# Patient Record
Sex: Female | Born: 2000 | Race: White | Hispanic: Yes | Marital: Single | Smoking: Never smoker
Health system: Southern US, Community
[De-identification: ages and names within clinical notes are randomized; demographics above are authoritative.]

## PROBLEM LIST (undated history)

## (undated) DIAGNOSIS — Z789 Other specified health status: Secondary | ICD-10-CM

## (undated) HISTORY — PX: TONSILLECTOMY: SUR1361

## (undated) HISTORY — DX: Other specified health status: Z78.9

---

## 2021-05-28 NOTE — L&D Delivery Note (Addendum)
OB/GYN Faculty Practice Delivery Note  Kc Sedlak is a 21 y.o. G1P1001 s/p SVD at [redacted]w[redacted]d. She was admitted for SOL.   ROM: 8h 22m with clear fluid GBS Status: neg Maximum Maternal Temperature: 101.3 immediately post-partum, afebrile intrapartum. Treated with tylenol. No fetal tachycardia.   Labor Progress: Progressed well with AROM and low dose pitocin augmentation.   Delivery Date/Time: 03/13/22, 7564 Delivery: Called to room and patient was complete and pushing. Head delivered ROA. No nuchal cord present. Shoulder and body delivered in usual fashion. Infant with spontaneous cry, placed on mother's abdomen, dried and stimulated. Cord clamped x 2 after 1-minute delay, and cut by FOB under my direct supervision. Cord blood drawn. Placenta delivered spontaneously with gentle cord traction. Fundus firm with massage and Pitocin. Labia, perineum, vagina, and cervix were inspected, second degree perineal laceration noted. Repaired with deep running 3.0 vicryl suture, skin closed in running subcuticular fashion. Good hemostasis achieved.   Placenta: complete, three vessel cord appreciated Complications: none Lacerations: 2ND degree perineal EBL: 227 mL Analgesia: epidural  Infant: female  APGARs 9/9  weight pending  Cecilio Asper, MD [facility], [place]   ___ GME ATTESTATION:  Evaluation and management procedures were performed by the [facility] Medicine Resident under my supervision. I was immediately available for direct supervision, assistance and direction throughout this encounter.  I also confirm that I have verified the information documented in the resident's note, and that I have also personally reperformed the pertinent components of the physical exam and all of the medical decision making activities.  I have also made any necessary editorial changes.  Shelda Pal, DO OB Fellow, [place] [facility] 03/13/2022 6:19 AM

## 2021-08-29 ENCOUNTER — Ambulatory Visit (INDEPENDENT_AMBULATORY_CARE_PROVIDER_SITE_OTHER): Payer: Medicaid Other

## 2021-08-29 VITALS — BP 110/73 | HR 79 | Ht 64.0 in | Wt 177.5 lb

## 2021-08-29 DIAGNOSIS — Z3401 Encounter for supervision of normal first pregnancy, first trimester: Secondary | ICD-10-CM | POA: Insufficient documentation

## 2021-08-29 MED ORDER — BLOOD PRESSURE KIT DEVI: 1.0000 | 0 refills | Status: DC

## 2021-08-29 MED ORDER — GOJJI WEIGHT SCALE MISC: 1.0000 | 0 refills | Status: DC

## 2021-08-29 NOTE — Progress Notes (Cosign Needed)
New OB Intake ? ?I connected with  Cindy Sellers on 08/29/21 at  8:15 AM EDT by in person and verified that I am speaking with the correct person using two identifiers. Nurse is located at [facility] and pt is located at [facility]. ? ?I discussed the limitations, risks, security and privacy concerns of performing an evaluation and management service by telephone and the availability of in person appointments. I also discussed with the patient that there may be a patient responsible charge related to this service. The patient expressed understanding and agreed to proceed. ? ?I explained I am completing New OB Intake today. We discussed her EDD of 03/08/22 that is based on early u/s completed at [facility] on 07/25/21. Pt is G1/P0. I reviewed her allergies, medications, Medical/Surgical/OB history, and appropriate screenings. I informed her of [facility] services. Based on history, this is a/an  pregnancy uncomplicated .  ? ?There are no problems to display for this patient. ? ? ?Concerns addressed today ? ?Delivery Plans:  ?Plans to deliver at [facility].  ? ?MyChart/Babyscripts ?MyChart access verified. I explained pt will have some visits in office and some virtually. Babyscripts instructions given and order placed. Patient verifies receipt of registration text/e-mail. Account successfully created and app downloaded. ? ?Blood Pressure Cuff  ?Blood pressure cuff ordered for patient to pick-up from [employer]. Explained after first prenatal appt pt will check weekly and document in Babyscripts. ? ?Weight scale: Patient does / does not  have weight scale. Weight scale ordered for patient to pick up from [employer].  ? ?Anatomy US ?Explained first scheduled [place] will be around 19 weeks. Dating and viability scan performed today. ? ?Labs ?Discussed Avelina Laine genetic screening with patient. Would like both Panorama and Horizon drawn at new OB visit.Also if interested in genetic testing, tell patient she will need  AFP 15-21 weeks to complete genetic testing .Routine prenatal labs needed. ? ?Covid Vaccine ?Patient has not covid vaccine.  ? ?Is patient interested in [place]? Yes  "Interested in [place] - Schedule next visit with CNM" on sticky note ? ?Informed patient of [facility] website  and placed link in her AVS.  ? ?Social Determinants of Health ?Food Insecurity: Patient denies food insecurity. ?WIC Referral: Patient is interested in referral to [facility]  ?Transportation: Patient denies transportation needs. ?Childcare: Discussed no children allowed at ultrasound appointments. Offered childcare services; patient declines childcare services at this time. ? ?Send link to Pregnancy Navigators ? ? ?Placed OB Box on problem list and updated ? ?First visit review ?I reviewed new OB appt with pt. I explained she will have a pelvic exam, ob bloodwork with genetic screening, and PAP smear. Explained pt will be seen by Milas Hock at first visit; encounter routed to appropriate provider. Explained that patient will be seen by pregnancy navigator following visit with provider. [facility] information placed in AVS.  ? ?Hamilton Capri, RN ?08/29/2021  8:30 AM  ?

## 2021-09-04 NOTE — Progress Notes (Signed)
Patient was assessed and managed by nursing staff during this encounter. I have reviewed the chart and agree with the documentation and plan. I have also made any necessary editorial changes. ? ?Emeterio Reeve, MD ?09/04/2021 3:25 PM  ? ?

## 2021-09-07 NOTE — Progress Notes (Signed)
? ?History:  ? Cindy Sellers is a 21 y.o. G1P0 at 49w4dby LMP being seen today for her first obstetrical visit.   Patient does intend to breast feed.  ? ?No prior pregnancy history.  ? ?Patient reports no complaints. ?  ? ?  ?HISTORY: ?OB History  ?Gravida Para Term Preterm AB Living  ?1 0 0 0 0 0  ?SAB IAB Ectopic Multiple Live Births  ?0 0 0 0 0  ?  ?# Outcome Date GA Lbr Len/2nd Weight Sex Delivery Anes PTL Lv  ?1 Current           ?  ? ? ?History reviewed. No pertinent past medical history. ?History reviewed. No pertinent surgical history. ?Family History  ?Problem Relation Age of Onset  ? Diabetes Maternal Grandfather   ? ?Social History  ? ?Tobacco Use  ? Smoking status: Never  ? Smokeless tobacco: Never  ?Vaping Use  ? Vaping Use: Former  ?Substance Use Topics  ? Alcohol use: Not Currently  ?  Comment: not since confirmed pregnancy  ? Drug use: Not Currently  ?  Types: Marijuana  ?  Comment: not since confirmed pregnancy  ? ?Not on File ?Current Outpatient Medications on File Prior to Visit  ?Medication Sig Dispense Refill  ? Blood Pressure Monitoring (BLOOD PRESSURE KIT) DEVI 1 kit by Does not apply route once a week. 1 each 0  ? Misc. Devices (GOJJI WEIGHT SCALE) MISC 1 Device by Does not apply route every 30 (thirty) days. 1 each 0  ? Prenatal Vit-Fe Fumarate-FA (MULTIVITAMIN-PRENATAL) 27-0.8 MG TABS tablet Take 1 tablet by mouth daily at 12 noon.    ? ?No current facility-administered medications on file prior to visit.  ? ? ?Review of Systems ?Pertinent items noted in HPI and remainder of comprehensive ROS otherwise negative. ? ?Physical Exam:  ? ?Vitals:  ? 09/18/21 1510  ?BP: 109/71  ?Pulse: 81  ?Weight: 180 lb 11.2 oz (82 kg)  ? ?Fetal Heart Rate (bpm): 147 ? ? ?General: well-developed, well-nourished female in no acute distress  ?Breasts:  normal appearance, no masses or tenderness bilaterally  ?Skin: normal coloration and turgor, no rashes  ?Neurologic: oriented, normal, negative, normal mood   ?Extremities: normal strength, tone, and muscle mass, ROM of all joints is normal  ?HEENT PERRLA, extraocular movement intact and sclera clear, anicteric  ?Neck supple and no masses  ?Cardiovascular: regular rate and rhythm  ?Respiratory:  no respiratory distress, normal breath sounds  ?Abdomen: soft, non-tender; bowel sounds normal; no masses,  no organomegaly  ?Pelvic: normal external genitalia, no lesions, normal vaginal mucosa, normal vaginal discharge, normal cervix, pap smear done. Uterine size:  aga  ?  ?Assessment:  ?  ?Pregnancy: G1P0 ?Patient Active Problem List  ? Diagnosis Date Noted  ? Encounter for supervision of normal first pregnancy in first trimester 08/29/2021  ? ?  ?Plan:  ?  ?1. Encounter for supervision of normal first pregnancy in first trimester ?Pap with GC/CT done ?Initial labs drawn. ?Continue prenatal vitamins. ?Problem list reviewed and updated. ?Genetic Screening discussed, NIPS: ordered. ?Ultrasound discussed; fetal anatomic survey: ordered. ?Anticipatory guidance about prenatal visits given including labs, ultrasounds, and testing. ?Discussed usage of Babyscripts and virtual visits ? ? ?The [facility] with multiple MDs and Advanced Practice Providers was explained to patient; also emphasized that residents, students are part of our team. ?Routine obstetric precautions reviewed. Encouraged to seek out care at office or emergency room (  [facility] preferred) for urgent and/or emergent concerns. ?Return in about 4 weeks (around 10/16/2021) for OB VISIT, MD or APP, Schedule [place] with MFM.  ?  ?Radene Gunning, MD, FACOG ?Obstetrician Social research officer, government, Faculty Practice ?[facility] [place] ? ?

## 2021-09-18 ENCOUNTER — Encounter: Payer: Self-pay | Admitting: Obstetrics and Gynecology

## 2021-09-18 ENCOUNTER — Other Ambulatory Visit (HOSPITAL_COMMUNITY)
Admission: RE | Admit: 2021-09-18 | Discharge: 2021-09-18 | Disposition: A | Discharge status: Home / Self Care | Payer: Medicaid Other | Source: Ambulatory Visit | Attending: Obstetrics and Gynecology | Admitting: Obstetrics and Gynecology

## 2021-09-18 ENCOUNTER — Ambulatory Visit (INDEPENDENT_AMBULATORY_CARE_PROVIDER_SITE_OTHER): Payer: Medicaid Other | Admitting: Obstetrics and Gynecology

## 2021-09-18 VITALS — BP 109/71 | HR 81 | Wt 180.7 lb

## 2021-09-18 DIAGNOSIS — Z3401 Encounter for supervision of normal first pregnancy, first trimester: Secondary | ICD-10-CM | POA: Insufficient documentation

## 2021-09-18 NOTE — Patient Instructions (Signed)
?  Considering Waterbirth? Guide for patients at [facility]) Why consider waterbirth? Gentle birth for babies  Less pain medicine used in labor  May allow for passive descent/less pushing  May reduce perineal tears  More mobility and instinctive maternal position changes  Increased maternal relaxation   Is waterbirth safe? What are the risks of infection, drowning or other complications? Infection:  Very low risk (3.7 % for tub vs 4.8% for bed)  7 in 8000 waterbirths with documented infection  Poorly cleaned equipment most common cause  Slightly lower group B strep transmission rate  Drowning  Maternal:  Very low risk  Related to seizures or fainting  Newborn:  Very low risk. No evidence of increased risk of respiratory problems in multiple large studies  Physiological protection from breathing under water  Avoid underwater birth if there are any fetal complications  Once baby's head is out of the water, keep it out.  Birth complication  Some reports of cord trauma, but risk decreased by bringing baby to surface gradually  No evidence of increased risk of shoulder dystocia. Mothers can usually change positions faster in water than in a bed, possibly aiding the maneuvers to free the shoulder.   There are 2 things you MUST do to have a waterbirth with [facility]: Attend a waterbirth class at [facility]   3rd Wednesday of every month from 7-9 pm (virtual during COVID) Free Register online at [email or web address] or [email or web address] or by calling [phone] Bring us the certificate from the class to your prenatal appointment or send via MyChart Meet with a midwife at 36 weeks* to see if you can still plan a waterbirth and to sign the consent.   *We also recommend that you schedule as many of your prenatal visits with a midwife as possible.    Helpful information: You may want to bring a bathing suit top to the hospital  to wear during labor but this is optional.  All other supplies are provided by the hospital. Please arrive at the hospital with signs of active labor, and do not wait at home until late in labor. It takes 45 min- 1 hour for fetal monitoring, and check in to your room to take place, plus transport and filling of the waterbirth tub.    Things that would prevent you from having a waterbirth: Premature, <37wks  Previous cesarean birth  Presence of thick meconium-stained fluid  Multiple gestation (Twins, triplets, etc.)  Uncontrolled diabetes or gestational diabetes requiring medication  Hypertension diagnosed in pregnancy or preexisting hypertension (gestational hypertension, preeclampsia, or chronic hypertension) Fetal growth restriction (your baby measures less than 10th percentile on ultrasound) Heavy vaginal bleeding  Non-reassuring fetal heart rate  Active infection (MRSA, etc.). Group B Strep is NOT a contraindication for waterbirth.  If your labor has to be induced and induction method requires continuous monitoring of the baby's heart rate  Other risks/issues identified by your obstetrical provider   Please remember that birth is unpredictable. Under certain unforeseeable circumstances your provider may advise against giving birth in the tub. These decisions will be made on a case-by-case basis and with the safety of you and your baby as our highest priority.   

## 2021-09-19 LAB — CYTOLOGY - PAP
Chlamydia: NEGATIVE
Comment: NEGATIVE
Comment: NORMAL
Diagnosis: NEGATIVE
Neisseria Gonorrhea: NEGATIVE

## 2021-09-20 LAB — CBC/D/PLT+RPR+RH+ABO+RUBIGG...
Antibody Screen: NEGATIVE
Basophils Absolute: 0 10*3/uL (ref 0.0–0.2)
Basos: 0 %
EOS (ABSOLUTE): 0.1 10*3/uL (ref 0.0–0.4)
Eos: 1 %
HCV Ab: NONREACTIVE
HIV Screen 4th Generation wRfx: NONREACTIVE
Hematocrit: 37.5 % (ref 34.0–46.6)
Hemoglobin: 12.8 g/dL (ref 11.1–15.9)
Hepatitis B Surface Ag: NEGATIVE
Immature Grans (Abs): 0 10*3/uL (ref 0.0–0.1)
Immature Granulocytes: 0 %
Lymphocytes Absolute: 2 10*3/uL (ref 0.7–3.1)
Lymphs: 18 %
MCH: 27.9 pg (ref 26.6–33.0)
MCHC: 34.1 g/dL (ref 31.5–35.7)
MCV: 82 fL (ref 79–97)
Monocytes Absolute: 0.5 10*3/uL (ref 0.1–0.9)
Monocytes: 5 %
Neutrophils Absolute: 8.1 10*3/uL — ABNORMAL HIGH (ref 1.4–7.0)
Neutrophils: 76 %
Platelets: 315 10*3/uL (ref 150–450)
RBC: 4.58 x10E6/uL (ref 3.77–5.28)
RDW: 13.7 % (ref 11.7–15.4)
RPR Ser Ql: NONREACTIVE
Rh Factor: POSITIVE
Rubella Antibodies, IGG: 3.5 index (ref >0.99)
WBC: 10.8 10*3/uL (ref 3.4–10.8)

## 2021-09-20 LAB — AFP, SERUM, OPEN SPINA BIFIDA
AFP MoM: 0.74
AFP Value: 21 ng/mL
Gest. Age on Collection Date: 15.4 weeks
Maternal Age At EDD: 21.8 yr
OSBR Risk 1 IN: 10000
Test Results:: NEGATIVE
Weight: 180 [lb_av]

## 2021-09-20 LAB — URINE CULTURE, OB REFLEX: Organism ID, Bacteria: NO GROWTH

## 2021-09-20 LAB — HCV INTERPRETATION

## 2021-09-20 LAB — CULTURE, OB URINE

## 2021-09-25 ENCOUNTER — Encounter: Payer: Self-pay | Admitting: Obstetrics

## 2021-10-11 ENCOUNTER — Other Ambulatory Visit: Payer: Self-pay | Admitting: Obstetrics and Gynecology

## 2021-10-11 ENCOUNTER — Ambulatory Visit: Payer: Medicaid Other | Attending: Obstetrics and Gynecology | Admitting: *Deleted

## 2021-10-11 ENCOUNTER — Ambulatory Visit (HOSPITAL_BASED_OUTPATIENT_CLINIC_OR_DEPARTMENT_OTHER): Payer: Medicaid Other

## 2021-10-11 ENCOUNTER — Other Ambulatory Visit: Payer: Self-pay | Admitting: *Deleted

## 2021-10-11 VITALS — BP 106/65 | HR 78

## 2021-10-11 DIAGNOSIS — Z3A18 18 weeks gestation of pregnancy: Secondary | ICD-10-CM | POA: Diagnosis not present

## 2021-10-11 DIAGNOSIS — Z3401 Encounter for supervision of normal first pregnancy, first trimester: Secondary | ICD-10-CM

## 2021-10-11 DIAGNOSIS — Z362 Encounter for other antenatal screening follow-up: Secondary | ICD-10-CM

## 2021-10-11 DIAGNOSIS — Z363 Encounter for antenatal screening for malformations: Secondary | ICD-10-CM | POA: Insufficient documentation

## 2021-10-11 DIAGNOSIS — O321XX0 Maternal care for breech presentation, not applicable or unspecified: Secondary | ICD-10-CM | POA: Diagnosis not present

## 2021-10-12 ENCOUNTER — Other Ambulatory Visit: Payer: Medicaid Other

## 2021-10-12 ENCOUNTER — Ambulatory Visit: Payer: Medicaid Other

## 2021-10-18 ENCOUNTER — Ambulatory Visit (INDEPENDENT_AMBULATORY_CARE_PROVIDER_SITE_OTHER): Payer: Medicaid Other | Admitting: Student

## 2021-10-18 VITALS — BP 102/66 | HR 78 | Wt 184.0 lb

## 2021-10-18 DIAGNOSIS — Z3A19 19 weeks gestation of pregnancy: Secondary | ICD-10-CM

## 2021-10-18 DIAGNOSIS — Z3401 Encounter for supervision of normal first pregnancy, first trimester: Secondary | ICD-10-CM

## 2021-10-18 NOTE — Progress Notes (Signed)
Pt reports fetal movement, denies pain.  

## 2021-10-18 NOTE — Progress Notes (Signed)
   PRENATAL VISIT NOTE  Subjective:  Cindy Sellers is a 21 y.o. G1P0 at [redacted]w[redacted]d being seen today for ongoing prenatal care.  She is currently monitored for the following issues for this low-risk pregnancy and has Encounter for supervision of normal first pregnancy in first trimester on their problem list.  Patient reports no complaints.  Contractions: Not present. Vag. Bleeding: None.  Movement: Present. Denies leaking of fluid.   The following portions of the patient's history were reviewed and updated as appropriate: allergies, current medications, past family history, past medical history, past social history, past surgical history and problem list.   Objective:   Vitals:   10/18/21 1120  BP: 102/66  Pulse: 78  Weight: 184 lb (83.5 kg)    Fetal Status: Fetal Heart Rate (bpm): 144   Movement: Present     General:  Alert, oriented and cooperative. Patient is in no acute distress.  Skin: Skin is warm and dry. No rash noted.   Cardiovascular: Normal heart rate noted  Respiratory: Normal respiratory effort, no problems with respiration noted  Abdomen: Soft, gravid, appropriate for gestational age.  Pain/Pressure: Absent     Pelvic: Cervical exam deferred        Extremities: Normal range of motion.  Edema: None  Mental Status: Normal mood and affect. Normal behavior. Normal judgment and thought content.   Assessment and Plan:  Pregnancy: G1P0 at [redacted]w[redacted]d 1. Encounter for supervision of normal first pregnancy in first trimester - Routine prenatal care -Interested in [place], [place] for prenatal visit with CNM  2. [redacted] weeks gestation of pregnancy - Anticipatory guidance for upcoming visits and labs discussed - [place] MFM OB F/U scheduled for 06/15 for completion of anatomy scan. LR NIPS/AFP   Preterm labor symptoms and general obstetric precautions including but not limited to vaginal bleeding, contractions, leaking of fluid and fetal movement were reviewed in detail with the  patient. Please refer to After Visit Summary for other counseling recommendations.   No follow-ups on file.  Future Appointments  Date Time Provider [place]  11/09/2021  3:30 PM [facility] NURSE [facility]  11/09/2021  3:45 PM [facility]  11/20/2021  1:30 PM [facility], Kathie Dike, CNM [facility] None    Johnston Ebbs, NP

## 2021-11-09 ENCOUNTER — Ambulatory Visit: Payer: Medicaid Other | Admitting: *Deleted

## 2021-11-09 ENCOUNTER — Encounter: Payer: Self-pay | Admitting: *Deleted

## 2021-11-09 ENCOUNTER — Ambulatory Visit: Payer: Medicaid Other | Attending: Obstetrics and Gynecology

## 2021-11-09 VITALS — BP 103/50 | HR 72

## 2021-11-09 DIAGNOSIS — Z362 Encounter for other antenatal screening follow-up: Secondary | ICD-10-CM | POA: Diagnosis present

## 2021-11-09 DIAGNOSIS — O358XX0 Maternal care for other (suspected) fetal abnormality and damage, not applicable or unspecified: Secondary | ICD-10-CM

## 2021-11-09 DIAGNOSIS — Z3A23 23 weeks gestation of pregnancy: Secondary | ICD-10-CM

## 2021-11-20 ENCOUNTER — Encounter: Payer: Self-pay | Admitting: Advanced Practice Midwife

## 2021-11-20 ENCOUNTER — Ambulatory Visit (INDEPENDENT_AMBULATORY_CARE_PROVIDER_SITE_OTHER): Payer: Medicaid Other | Admitting: Advanced Practice Midwife

## 2021-11-20 VITALS — BP 104/68 | HR 71 | Wt 189.0 lb

## 2021-11-20 DIAGNOSIS — Z3401 Encounter for supervision of normal first pregnancy, first trimester: Secondary | ICD-10-CM

## 2021-11-20 DIAGNOSIS — Z3A24 24 weeks gestation of pregnancy: Secondary | ICD-10-CM

## 2021-12-20 ENCOUNTER — Other Ambulatory Visit: Payer: Medicaid Other

## 2021-12-20 ENCOUNTER — Ambulatory Visit (INDEPENDENT_AMBULATORY_CARE_PROVIDER_SITE_OTHER): Payer: Medicaid Other | Admitting: Obstetrics and Gynecology

## 2021-12-20 ENCOUNTER — Encounter: Payer: Self-pay | Admitting: Obstetrics and Gynecology

## 2021-12-20 VITALS — BP 110/70 | HR 83 | Wt 200.0 lb

## 2021-12-20 DIAGNOSIS — Z23 Encounter for immunization: Secondary | ICD-10-CM | POA: Diagnosis not present

## 2021-12-20 DIAGNOSIS — Z3401 Encounter for supervision of normal first pregnancy, first trimester: Secondary | ICD-10-CM

## 2021-12-20 DIAGNOSIS — Z3A28 28 weeks gestation of pregnancy: Secondary | ICD-10-CM

## 2021-12-20 NOTE — Patient Instructions (Signed)
AREA PEDIATRIC/FAMILY PRACTICE PHYSICIANS  [place]outheast [place] ([address])  [facility] Chambliss, MD; Eniola, MD; Hale, MD; Hensel, MD; McDiarmid, MD; McIntyer, MD; Neal, MD; Walden, MD [address] [place], [place] [address] [phone] Mon-Fri 8:30-12:30, 1:30-5:00 Providers come to see babies at [facility] Accepting Medicaid [facility] providers who accept newborns: Koirala, MD; Morrow, MD; Wolters, MD [address], [place], [place] [address] [phone] Mon-Fri 8:00-5:30 Babies seen by providers at [facility] Does NOT accept Medicaid Please call early in hospitalization for appointment (limited availability)  [facility] Mulberry, MD [address] [place], [place] [address] [phone] Mon, Tue, Thur, Fri 8:30-5:00, Wed 10:00-7:00 (closed 1-2pm) Babies seen by [facility] providers Accepting Medicaid Rubin - Pediatrician Rubin, MD [address], [place], [place] [address] [phone] Mon-Fri 8:30-5:00, Sat 8:30-12:00 Provider comes to see babies at [facility] Accepting Medicaid Must have been referred from current patients or contacted office prior to delivery [facility] ([facility]) Brown, MD; Chandler, MD; Ettefagh, MD; Grant, MD; Lester, MD; McCormick, MD; McQueen, MD; Prose, MD; Simha, MD; Stanley, MD; Stryffeler, NP; Tebben, NP [address], [place], [place] [address] [phone] Mon, Tue, Thur, Fri 8:30-5:30, Wed 9:30-5:30, Sat 8:30-12:30 Babies seen by [facility] providers Accepting Medicaid Only accepting infants of first-time parents or siblings of current patients Hospital discharge coordinator will make follow-up appointment Jack Amos [address], [place], [place]  [address] [phone]   Fax - [phone] [facility] [address], [place], [place]  [address] Phone - [phone]   Fax - [phone] Shilpa Gosrani [address], [place], [place]  [address] [phone]  [place] ([address]) [facility] Bates, MD; Brassfield, MD; Cooper, Cox, MD; MD; Davis, MD; Dovico, MD; Ettefaugh, MD; Little, MD; Lowe, MD; Keiffer, MD; Melvin, MD; Sumner, MD; Williams, MD [address], [place], [place] [address] [phone] Mon-Fri 8:30-5:00 (extended evenings Mon-Thur as needed), Sat-Sun 10:00-1:00 Providers come to see babies at [facility] Accepting Medicaid for families of first-time babies and families with all children in the household age 3 and under. Must register with office prior to making appointment (M-F only). [facility] Henson, NP; Knapp, MD; Lalonde, MD; Tysinger, PA [address] [place], [place] [address] [phone] Mon-Fri 8:00-5:00 Babies seen by providers at [facility] Does NOT accept Medicaid/Commercial Insurance Only [facility] ([facility])  Artis, MD; Barnes, MD; Bratton, MD; Coccaro, MD; Lockett Gardner, MD; Kramer, MD; Marshall, MD; Netherton, MD; Poleto, MD; Skinner, MD [address] [place], [place] [address] [phone] Mon-Fri 8:30-5:30, Sat (Oct.-Mar.) 9:00-1:00 Babies seen by providers at [facility] Accepting Medicaid  [place] ([address]) [facility] Reid, MD; Warner, MD [address], [place], [place] [address] [phone] Mon-Fri 8:30-5:00, Sat 8:30-12:00 Providers come to see babies at [facility] Does NOT accept Medicaid [facility] Becker, PA; Hagler, MD; Scifres, PA; Sun, MD; Swayne, MD [address], [place], [place] [address] [phone] Mon-Fri 8:00-5:00 Babies seen by providers at [facility] Does NOT accept Medicaid Only accepting babies of parents who  are patients Please call early in hospitalization for appointment (limited availability) [facility] Clark, MD; Frye, MD; Kelleher, MD; Mack, NP; Miller, MD; O'Keller, MD; Patterson, NP; Pudlo, MD; Puzio, MD; Thomas, MD; Tucker, MD; Twiselton, MD [address], [place], [place] [address] [phone] Mon-Fri 8:00-5:00, Sat 9:00-12:00 Providers come to see babies at [facility] Does NOT accept Medicaid  [place] ([address]) [facility] providers accepting new patients: Brake, NP; Wharton, PA [address], [place], [place] [address] [phone] Mon-Fri 8:00-5:00 Babies seen by providers at [facility] Does NOT accept Medicaid Only accepting babies of parents who are patients Please call early in hospitalization for appointment (limited availability) [facility] Gay, MD; Quinlan, MD [address] [place], [place] [address] [phone] (press 1 to schedule appointment) Mon-Fri 8:00-5:00 Providers come to see babies at [facility] Does NOT accept Medicaid [facility] Mazer, MD [address] [place], [place] [address] [phone] Mon-Fri 8:30-5:00 (lunch 12:30-1:00), extended hours by appointment only Wed 5:00-6:30 Babies seen by [facility] providers Accepting Medicaid [facility] Banks, MD; Jordan, MD; Koberlein, MD [address], [place], [place] [address] [phone] Mon-Fri 8:00-5:00 Babies seen by [facility] providers Does NOT accept Medicaid [facility] Parker, MD; Hunter, MD; Wallace, DO [address] [place], [place] [address] [phone] Mon-Fri 8:00-5:00 Babies seen by [facility] providers Does NOT accept Medicaid [facility] Brandon, PA; Brecken, PA; Christy, NP; Dees, MD; DeClaire, MD; DeWeese, MD; Hansen, NP; Mills, NP; Parrish, NP; Smoot, NP; Summer, MD; Vapne,  MD [address] [place], [place] [address] [phone] Mon-Fri 8:30-5:00, Sat 10:00-1:00 Providers come to see babies at [facility] Does NOT accept Medicaid Free prenatal information session Tuesdays at 4:45pm [facility] Bouska, MD; Gordon, PA; Jeffery, PA; Weber, PA [address] [place] [address] [phone] Mon-Fri 7:30-5:30 Babies seen by [facility] providers [facility] [address], [place], [place]  [address] [phone]   Fax - [phone]  [place] ([address] & [address]) [facility] [place] [address] [place], [place] [address] [phone] Mon-Thur 8:00-6:00 Providers come to see babies at [facility] Accepting Medicaid [facility] Anderson, NP; Badger, MD; Beal, PA; Spencer, PA [address] [place], [place] [address] [phone] Mon-Thur 7:30-7:30, Fri 7:30-4:30 Babies seen by [facility] providers Accepting Medicaid [facility] Agbuya, MD; Klett, NP; Romgoolam, MD [address], [place], [place] [address] [phone] Mon-Fri 8:30-5:00, Sat 8:30-12:00 Providers come to see babies at [facility] Accepting Medicaid Must have "Meet & Greet" appointment at office prior to delivery [facility] ([facility]) McCord, MD; Wallace, MD; Wood, MD [address], [place], [place] [address] [phone] Mon-Wed 8:00-6:00, Thur-Fri 8:00-5:00, Sat 9:00-12:00 Providers come to see babies at [facility] Does NOT accept Medicaid Only accepting siblings of current patients [facility]  [address], [place], [place]  [address] [phone]   Fax - [phone] [facility] [address], [place], [place]  [address] [phone]   Fax -  [phone]  [place] ([address] & [address]) [facility] Cirigliano, DO; Matthews, DO [address] [place], [place] [address] [phone] Mon-Fri 7:00-5:00 Babies seen by [facility] providers Does NOT accept Medicaid [facility] Briscoe, MD; Howley, PA; Moreira, PA [address], [place], [place] [address] [phone] Mon-Fri 8:00-5:00 Babies seen by [facility] providers Accepting Medicaid [facility] Boyd, MD; Church, PA; Jones, NP; Osborn, PA [address], [place], [place] [address] [phone] Mon-Fri 8:00-5:00 Babies seen by providers at [facility] Accepting Medicaid  [place] ([address]) [facility] [place] Wendling, DO [address] [place], [place] [address] [phone] Mon-Fri 8:00-5:00 Babies seen by [facility] providers Does NOT accept Medicaid Limited availability, please call early in hospitalization to schedule follow-up [facility] Calderon, PA; Cummings, MD; Dillard, MD; Martin, PA; Olson, MD; VanDeven, PA [address], [place], [place] [address] [phone] Mon-Fri 8:30-5:00, Sat 9:00-12:00 Babies seen by providers at [facility] Accepting Medicaid Please register online then schedule online or call office [email or web address] ([facility] Hunter, NP; Kumar, MD; Martin Rogers, PA [address], [place], [place] [address] [phone] Mon-Fri 8:00-5:00 Babies seen by providers at [facility] Accepting Medicaid [facility] ([facility]) Halfway, MD; Kristi Fleenor, NP; West, MD [address], [place], [place] [address] [phone] Mon-Fri 8:00-5:30, Sat&Sun by appointment (phones open at  8:30) Babies seen by [facility] providers Accepting Medicaid Must be a first-time baby or sibling of current patient [facility] [place]  [address], [place], [place]  [address] [phone]   Fax - [phone]  [place] ([address]) [facility] Brown, PA; Cowen, PA; Rice, MD; Helton, PA; Spry, MD [address] [place], [place] [address] [phone] Mon-Thur 8:00-7:00, Fri 8:00-5:00, Sat 8:00-12:00, Sun 9:00-12:00 Babies seen by [facility] providers Accepting Medicaid [facility] [place] Coe-Goins, MD; Marshall, MD; Pierre-Louis, MD [address] [place] [address], [place], [place] [address] [phone] Mon-Thur 8:00-5:00 Babies seen by providers at [facility] Accepting Medicaid [facility] Bratton, MD; Coe-Goins, MD; Hayes, MD; Lewis, MD; List, MD; Lott, MD; Marshall, MD; Moran, MD; O'Neal, MD; Pierre-Louis, MD; Pitonzo, MD; Scholer, MD; Spangle, MD [address] [place], [place] [address] [phone] Mon-Fri 8:00-5:30, Sat (Oct.-Mar.) 9:00-1:00 Babies seen by providers at [facility] Accepting Medicaid Must fill out new patient packet, available online at [email or web address] [facility] ([facility]) Friddle, NP; Harris, NP; Kelly, NP; Logan, MD; Melvin, PA; Poth, MD; Ramadoss, MD; Stanton, NP [address], [place], [place] [address] [phone] Mon-Thur 8:00-5:30, Fri 8:00-5:00 Babies seen by providers at [facility] Accepting Medicaid  [place] ([address]) [facility] Dixon, PA; [place], MD; Pickard, MD; Tapia, PA [address], [place], [place] [address] [phone] Mon-Fri 8:00-5:00 Babies seen by providers at [facility] Accepting Medicaid   [place] ([address]) [facility] [place] Masneri, DO; Meyers, MD; Nelson, PA [address], [place], [place] [address] [phone] Mon-Fri 8:00-5:00 Babies seen by providers at [facility] Does NOT accept Medicaid Limited appointment availability, please call early in hospitalization  [facility] [place] Kunedd, DO; McGowen, MD [address], [place], [place] [address] [phone] Mon-Fri 8:00-5:00 Babies seen by [facility] providers Does NOT accept Medicaid [facility] [place] Cameron, MD; MacDonald, MD; Michaels, PA; Nayak, MD [address] [place] [address], [place], [place] [address] [phone] Mon-Fri 8:00-5:00 After hours clinic ([address] [place], [place] [address]) [phone] Mon-Fri 5:00-8:00, Sat 12:00-6:00, Sun 10:00-4:00 Babies seen by [facility] providers Accepting Medicaid [facility] [place] [address], [place], [place]  [address] [phone]   Fax - [phone]  [place] ([address]) [facility] [place] [facility] Andy, MD [address], [place], [place] [address] [phone] Mon-Fri 8:00-5:00 Babies seen by [facility] providers Does NOT accept Medicaid [facility] [place] ([facility] [place]) Eksir, MD [address], [place], [place] [address] [phone] Mon-Thur 8:00-7:00, Fri 8:00-5:00, Sat 8:00-12:00 Babies seen by providers at [facility] Accepting Medicaid - but does not have vaccinations in office (must be received elsewhere) Limited availability, please call early in hospitalization  [place] ([address]) [facility]  Charlene Flemming, MD [address], [place] [address] [phone]  Fax [phone]  [facility] [facility]  Kimberly Newton, MD, Annamarie Streilein, PA, Carla Hampton, PA [address] [place], [place]  [address] [phone] [facility]  [address], [place], [place] [address] [phone] [address], [place], [place] [address] [phone] (West Office)  [facility] [address], [place], [place] [address] [phone] [facility] [address], [place], [place] [address] [phone] [facility] [address], [place], [place] [address] [phone] [facility] [address], [place], [place] [address] [phone] [facility] [address], [place], [place] [address] [phone] [facility] [address], [place], [place] [address] [phone] [facility]  [address], [place], [place] [address] [phone] Dr[facility] [address] [place] [address], [place], [place] [address] [phone] [facility] [address], [place], [place] [address] [phone] [facility] [address], [place], [place] [address] [phone]  

## 2021-12-20 NOTE — Progress Notes (Signed)
   PRENATAL VISIT NOTE  Subjective:  Cindy Sellers is a 21 y.o. G1P0 at [redacted]w[redacted]d being seen today for ongoing prenatal care.  She is currently monitored for the following issues for this low-risk pregnancy and has Encounter for supervision of normal first pregnancy in first trimester on their problem list.  Patient reports no complaints.  Contractions: Not present. Vag. Bleeding: None.  Movement: Present. Denies leaking of fluid.   The following portions of the patient's history were reviewed and updated as appropriate: allergies, current medications, past family history, past medical history, past social history, past surgical history and problem list.   Objective:   Vitals:   12/20/21 0851  BP: 110/70  Pulse: 83  Weight: 200 lb (90.7 kg)    Fetal Status: Fetal Heart Rate (bpm): 143 Fundal Height: 28 cm Movement: Present     General:  Alert, oriented and cooperative. Patient is in no acute distress.  Skin: Skin is warm and dry. No rash noted.   Cardiovascular: Normal heart rate noted  Respiratory: Normal respiratory effort, no problems with respiration noted  Abdomen: Soft, gravid, appropriate for gestational age.  Pain/Pressure: Absent     Pelvic: Cervical exam deferred        Extremities: Normal range of motion.     Mental Status: Normal mood and affect. Normal behavior. Normal judgment and thought content.   Assessment and Plan:  Pregnancy: G1P0 at [redacted]w[redacted]d 1. Encounter for supervision of normal first pregnancy in first trimester Patient is doing well without complaints Third trimester labs and glucola today Tdap today Patient is undecided on contraception and pediatrician- information provided for both Patient completed waterbirth class - Tdap vaccine greater than or equal to 7yo IM  Preterm labor symptoms and general obstetric precautions including but not limited to vaginal bleeding, contractions, leaking of fluid and fetal movement were reviewed in detail with the  patient. Please refer to After Visit Summary for other counseling recommendations.   No follow-ups on file.  No future appointments.  Catalina Antigua, MD

## 2021-12-20 NOTE — Addendum Note (Signed)
Addended by: Leola Brazil on: 12/20/2021 10:19 AM   Modules accepted: Orders

## 2021-12-20 NOTE — Progress Notes (Signed)
PHQ9: 2 GAD7: 1

## 2021-12-21 LAB — CBC
Hematocrit: 35.9 % (ref 34.0–46.6)
Hemoglobin: 11.8 g/dL (ref 11.1–15.9)
MCH: 28 pg (ref 26.6–33.0)
MCHC: 32.9 g/dL (ref 31.5–35.7)
MCV: 85 fL (ref 79–97)
Platelets: 289 10*3/uL (ref 150–450)
RBC: 4.22 x10E6/uL (ref 3.77–5.28)
RDW: 13.1 % (ref 11.7–15.4)
WBC: 14.1 10*3/uL — ABNORMAL HIGH (ref 3.4–10.8)

## 2021-12-21 LAB — HIV ANTIBODY (ROUTINE TESTING W REFLEX): HIV Screen 4th Generation wRfx: NONREACTIVE

## 2021-12-21 LAB — GLUCOSE TOLERANCE, 2 HOURS W/ 1HR
Glucose, 1 hour: 167 mg/dL (ref 70–179)
Glucose, 2 hour: 142 mg/dL (ref 70–152)
Glucose, Fasting: 81 mg/dL (ref 70–91)

## 2021-12-21 LAB — RPR: RPR Ser Ql: NONREACTIVE

## 2022-01-03 ENCOUNTER — Encounter: Payer: Self-pay | Admitting: Obstetrics and Gynecology

## 2022-01-03 ENCOUNTER — Ambulatory Visit (INDEPENDENT_AMBULATORY_CARE_PROVIDER_SITE_OTHER): Payer: Medicaid Other | Admitting: Obstetrics and Gynecology

## 2022-01-03 VITALS — BP 101/66 | HR 78 | Wt 197.6 lb

## 2022-01-03 DIAGNOSIS — Z3A30 30 weeks gestation of pregnancy: Secondary | ICD-10-CM

## 2022-01-03 DIAGNOSIS — Z3401 Encounter for supervision of normal first pregnancy, first trimester: Secondary | ICD-10-CM

## 2022-01-03 DIAGNOSIS — Z3403 Encounter for supervision of normal first pregnancy, third trimester: Secondary | ICD-10-CM

## 2022-01-03 NOTE — Progress Notes (Signed)
Patient presents for ROB. Patient has no concerns today. 

## 2022-01-03 NOTE — Progress Notes (Signed)
   LOW-RISK PREGNANCY OFFICE VISIT Patient name: Cindy Sellers MRN [id number]  Date of birth: 04-22-2001 Chief Complaint:   Routine Prenatal Visit  History of Present Illness:   Cindy Sellers is a 21 y.o. G1P0 female at [redacted]w[redacted]d with an Estimated Date of Delivery: 03/08/22 being seen today for ongoing management of a low-risk pregnancy.  Today she reports no complaints. Contractions: Not present. Vag. Bleeding: None.  Movement: Present. denies leaking of fluid. Review of Systems:   Pertinent items are noted in HPI Denies abnormal vaginal discharge w/ itching/odor/irritation, headaches, visual changes, shortness of breath, chest pain, abdominal pain, severe nausea/vomiting, or problems with urination or bowel movements unless otherwise stated above. Pertinent History Reviewed:  Reviewed past medical,surgical, social, obstetrical and family history.  Reviewed problem list, medications and allergies. Physical Assessment:   Vitals:   01/03/22 1406  BP: 101/66  Pulse: 78  Weight: 197 lb 9.6 oz (89.6 kg)  Body mass index is 33.92 kg/m.        Physical Examination:   General appearance: Well appearing, and in no distress  Mental status: Alert, oriented to person, place, and time  Skin: Warm & dry  Cardiovascular: Normal heart rate noted  Respiratory: Normal respiratory effort, no distress  Abdomen: Soft, gravid, nontender  Pelvic: Cervical exam deferred         Extremities: Edema: Trace  Fetal Status: Fetal Heart Rate (bpm): 135 Fundal Height: 33 cm Movement: Present    No results found for this or any previous visit (from the past 24 hour(s)).  Assessment & Plan:  1) Low-risk pregnancy G1P0 at [redacted]w[redacted]d with an Estimated Date of Delivery: 03/08/22   2) Encounter for supervision of normal first pregnancy in first trimester - Dicussed larger FH from today, but because FH has been WNL up until now, will not schedule growth U/S for possible LGA -- will consider growth U/S if continue  to measure larger  3) [redacted] weeks gestation of pregnancy    Meds: No orders of the defined types were placed in this encounter.  Labs/procedures today: none  Plan:  Continue routine obstetrical care   Reviewed: Preterm labor symptoms and general obstetric precautions including but not limited to vaginal bleeding, contractions, leaking of fluid and fetal movement were reviewed in detail with the patient.  All questions were answered. Has home bp cuff. Check bp weekly, let us know if >140/90.   Follow-up: Return in about 2 weeks (around 01/17/2022) for Return OB visit.  No orders of the defined types were placed in this encounter.  Raelyn Mora MSN, CNM 01/03/2022 2:43 PM

## 2022-01-17 ENCOUNTER — Encounter: Payer: Self-pay | Admitting: Student

## 2022-01-17 ENCOUNTER — Ambulatory Visit (INDEPENDENT_AMBULATORY_CARE_PROVIDER_SITE_OTHER): Payer: Medicaid Other | Admitting: Student

## 2022-01-17 VITALS — BP 104/63 | HR 74 | Wt 200.3 lb

## 2022-01-17 DIAGNOSIS — Z3A32 32 weeks gestation of pregnancy: Secondary | ICD-10-CM

## 2022-01-17 DIAGNOSIS — Z3401 Encounter for supervision of normal first pregnancy, first trimester: Secondary | ICD-10-CM

## 2022-01-17 DIAGNOSIS — Z3403 Encounter for supervision of normal first pregnancy, third trimester: Secondary | ICD-10-CM

## 2022-01-17 NOTE — Progress Notes (Signed)
   PRENATAL VISIT NOTE  Subjective:  Cindy Sellers is a 21 y.o. G1P0 at [redacted]w[redacted]d being seen today for ongoing prenatal care.  She is currently monitored for the following issues for this low-risk pregnancy and has Encounter for supervision of normal first pregnancy in first trimester on their problem list.  Patient reports no complaints.  Contractions: Not present. Vag. Bleeding: None.  Movement: Present. Denies leaking of fluid.   The following portions of the patient's history were reviewed and updated as appropriate: allergies, current medications, past family history, past medical history, past social history, past surgical history and problem list.   Objective:   Vitals:   01/17/22 1459  BP: 104/63  Pulse: 74  Weight: 200 lb 4.8 oz (90.9 kg)    Fetal Status: Fetal Heart Rate (bpm): 140 Fundal Height: 32 cm Movement: Present     General:  Alert, oriented and cooperative. Patient is in no acute distress.  Skin: Skin is warm and dry. No rash noted.   Cardiovascular: Normal heart rate noted  Respiratory: Normal respiratory effort, no problems with respiration noted  Abdomen: Soft, gravid, appropriate for gestational age.  Pain/Pressure: Absent     Pelvic: Cervical exam deferred        Extremities: Normal range of motion.  Edema: Trace  Mental Status: Normal mood and affect. Normal behavior. Normal judgment and thought content.   Assessment and Plan:  Pregnancy: G1P0 at [redacted]w[redacted]d  1. Encounter for supervision of normal first pregnancy in first trimester - Doing well, vigorous fetal movement - Discussed circumcision procedure and care management in the hospital -Interest in waterbirth, next appt. is with CNM  2. [redacted] weeks gestation of pregnancy    Preterm labor symptoms and general obstetric precautions including but not limited to vaginal bleeding, contractions, leaking of fluid and fetal movement were reviewed in detail with the patient. Please refer to After Visit Summary for  other counseling recommendations.   Return in about 2 weeks (around 01/31/2022) for LOB, IN-PERSON.  Future Appointments  Date Time Provider Department Center  01/31/2022  2:50 PM Leftwich-Kirby, Wilmer Floor, CNM [facility] None  02/14/2022  2:50 PM Corlis Hove, NP [facility] None  02/21/2022  2:50 PM Corlis Hove, NP [facility] None  02/28/2022  2:50 PM Gerrit Heck, CNM [facility] None  03/08/2022  2:50 PM Raelyn Mora, CNM [facility] None    Corlis Hove, NP

## 2022-01-31 ENCOUNTER — Ambulatory Visit (INDEPENDENT_AMBULATORY_CARE_PROVIDER_SITE_OTHER): Payer: Medicaid Other | Admitting: Advanced Practice Midwife

## 2022-01-31 VITALS — BP 118/68 | HR 80 | Wt 202.4 lb

## 2022-01-31 DIAGNOSIS — Z3403 Encounter for supervision of normal first pregnancy, third trimester: Secondary | ICD-10-CM

## 2022-01-31 DIAGNOSIS — Z3401 Encounter for supervision of normal first pregnancy, first trimester: Secondary | ICD-10-CM

## 2022-01-31 DIAGNOSIS — Z3A34 34 weeks gestation of pregnancy: Secondary | ICD-10-CM

## 2022-01-31 NOTE — Progress Notes (Unsigned)
   PRENATAL VISIT NOTE  Subjective:  Cindy Sellers is a 21 y.o. G1P0 at [redacted]w[redacted]d being seen today for ongoing prenatal care.  She is currently monitored for the following issues for this {Blank single:19197::"high-risk","low-risk"} pregnancy and has Encounter for supervision of normal first pregnancy in first trimester on their problem list.  Patient reports {sx:14538}.  Contractions: Not present. Vag. Bleeding: None.  Movement: Present. Denies leaking of fluid.   The following portions of the patient's history were reviewed and updated as appropriate: allergies, current medications, past family history, past medical history, past social history, past surgical history and problem list.   Objective:   Vitals:   01/31/22 1456  BP: 118/68  Pulse: 80  Weight: 202 lb 6.4 oz (91.8 kg)    Fetal Status: Fetal Heart Rate (bpm): 145   Movement: Present     General:  Alert, oriented and cooperative. Patient is in no acute distress.  Skin: Skin is warm and dry. No rash noted.   Cardiovascular: Normal heart rate noted  Respiratory: Normal respiratory effort, no problems with respiration noted  Abdomen: Soft, gravid, appropriate for gestational age.  Pain/Pressure: Absent     Pelvic: {Blank single:19197::"Cervical exam performed in the presence of a chaperone","Cervical exam deferred"}        Extremities: Normal range of motion.  Edema: Trace  Mental Status: Normal mood and affect. Normal behavior. Normal judgment and thought content.   Assessment and Plan:  Pregnancy: G1P0 at [redacted]w[redacted]d 1. Encounter for supervision of normal first pregnancy in first trimester ***  2. [redacted] weeks gestation of pregnancy ***  {Blank single:19197::"Term","Preterm"} labor symptoms and general obstetric precautions including but not limited to vaginal bleeding, contractions, leaking of fluid and fetal movement were reviewed in detail with the patient. Please refer to After Visit Summary for other counseling  recommendations.   No follow-ups on file.  Future Appointments  Date Time Provider Department Center  02/14/2022  2:50 PM Corlis Hove, NP [facility] None  02/21/2022  2:50 PM Corlis Hove, NP [facility] None  02/28/2022  2:50 PM Gerrit Heck, CNM [facility] None  03/08/2022  2:50 PM Raelyn Mora, CNM [facility] None    Sharen Counter, CNM

## 2022-01-31 NOTE — Progress Notes (Signed)
Pt reports fetal movement, denies pain.  

## 2022-02-02 ENCOUNTER — Encounter: Payer: Self-pay | Admitting: Advanced Practice Midwife

## 2022-02-14 ENCOUNTER — Other Ambulatory Visit (HOSPITAL_COMMUNITY)
Admission: RE | Admit: 2022-02-14 | Discharge: 2022-02-14 | Disposition: A | Discharge status: Home / Self Care | Payer: Medicaid Other | Source: Ambulatory Visit | Attending: Student | Admitting: Student

## 2022-02-14 ENCOUNTER — Ambulatory Visit (INDEPENDENT_AMBULATORY_CARE_PROVIDER_SITE_OTHER): Payer: Medicaid Other | Admitting: Student

## 2022-02-14 VITALS — BP 107/68 | HR 80 | Wt 204.5 lb

## 2022-02-14 DIAGNOSIS — Z3403 Encounter for supervision of normal first pregnancy, third trimester: Secondary | ICD-10-CM

## 2022-02-14 DIAGNOSIS — Z3A36 36 weeks gestation of pregnancy: Secondary | ICD-10-CM

## 2022-02-14 NOTE — Progress Notes (Signed)
   PRENATAL VISIT NOTE  Subjective:  Cindy Sellers is a 21 y.o. G1P0 at [redacted]w[redacted]d being seen today for ongoing prenatal care.  She is currently monitored for the following issues for this low-risk pregnancy and has Encounter for supervision of normal first pregnancy in first trimester on their problem list.  Patient reports no complaints.  Contractions: Not present. Vag. Bleeding: None.  Movement: Present. Denies leaking of fluid.   The following portions of the patient's history were reviewed and updated as appropriate: allergies, current medications, past family history, past medical history, past social history, past surgical history and problem list.   Objective:   Vitals:   02/14/22 1505  BP: 107/68  Pulse: 80  Weight: 204 lb 8 oz (92.8 kg)    Fetal Status: Fetal Heart Rate (bpm): 160 Fundal Height: 36 cm Movement: Present  Presentation: Vertex  General:  Alert, oriented and cooperative. Patient is in no acute distress.  Skin: Skin is warm and dry. No rash noted.   Cardiovascular: Normal heart rate noted  Respiratory: Normal respiratory effort, no problems with respiration noted  Abdomen: Soft, gravid, appropriate for gestational age.  Pain/Pressure: Absent     Pelvic: Cervical exam deferred        Extremities: Normal range of motion.  Edema: Trace  Mental Status: Normal mood and affect. Normal behavior. Normal judgment and thought content.   Assessment and Plan:  Pregnancy: G1P0 at [redacted]w[redacted]d 1. Encounter for supervision of normal first pregnancy in third trimester - Doing well, no complaints, ready for baby to be here - Frequent fetal movement - Culture, beta strep (group b only) - Cervicovaginal ancillary only( [place])  2. [redacted] weeks gestation of pregnancy - Weekly follow-up until delivery - Culture, beta strep (group b only) - Cervicovaginal ancillary only( [place])  Preterm labor symptoms and general obstetric precautions including but not limited to vaginal  bleeding, contractions, leaking of fluid and fetal movement were reviewed in detail with the patient. Please refer to After Visit Summary for other counseling recommendations.   Return in about 1 week (around 02/21/2022) for LOB, IN-PERSON.  Future Appointments  Date Time Provider [place]  02/21/2022  2:50 PM Johnston Ebbs, NP [place] None  02/28/2022  2:50 PM Gavin Pound, CNM [place] None  03/08/2022  2:50 PM Laury Deep, CNM [facility] None    Johnston Ebbs, NP

## 2022-02-14 NOTE — Progress Notes (Signed)
Pt reports fetal movement, denies pain.  

## 2022-02-15 LAB — CERVICOVAGINAL ANCILLARY ONLY
Chlamydia: NEGATIVE
Comment: NEGATIVE
Comment: NORMAL
Neisseria Gonorrhea: NEGATIVE

## 2022-02-18 LAB — CULTURE, BETA STREP (GROUP B ONLY): Strep Gp B Culture: NEGATIVE

## 2022-02-21 ENCOUNTER — Encounter: Payer: Self-pay | Admitting: Student

## 2022-02-21 ENCOUNTER — Ambulatory Visit (INDEPENDENT_AMBULATORY_CARE_PROVIDER_SITE_OTHER): Payer: Medicaid Other | Admitting: Student

## 2022-02-21 VITALS — BP 107/66 | HR 72 | Wt 208.2 lb

## 2022-02-21 DIAGNOSIS — Z3403 Encounter for supervision of normal first pregnancy, third trimester: Secondary | ICD-10-CM

## 2022-02-21 DIAGNOSIS — Z3A37 37 weeks gestation of pregnancy: Secondary | ICD-10-CM

## 2022-02-21 NOTE — Progress Notes (Signed)
Patient presents for ROB visit. No concerns at this time.   

## 2022-02-21 NOTE — Progress Notes (Signed)
   PRENATAL VISIT NOTE  Subjective:  Cindy Sellers is a 21 y.o. G1P0 at [redacted]w[redacted]d being seen today for ongoing prenatal care.  She is currently monitored for the following issues for this low-risk pregnancy and has Encounter for supervision of normal first pregnancy in first trimester on their problem list.  Patient reports no complaints.  Contractions: Not present. Vag. Bleeding: None.  Movement: Present. Denies leaking of fluid.   The following portions of the patient's history were reviewed and updated as appropriate: allergies, current medications, past family history, past medical history, past social history, past surgical history and problem list.   Objective:   Vitals:   02/21/22 1457  BP: 107/66  Pulse: 72  Weight: 208 lb 3.2 oz (94.4 kg)    Fetal Status: Fetal Heart Rate (bpm): 133 Fundal Height: 37 cm Movement: Present     General:  Alert, oriented and cooperative. Patient is in no acute distress.  Skin: Skin is warm and dry. No rash noted.   Cardiovascular: Normal heart rate noted  Respiratory: Normal respiratory effort, no problems with respiration noted  Abdomen: Soft, gravid, appropriate for gestational age.  Pain/Pressure: Present     Pelvic: Cervical exam deferred        Extremities: Normal range of motion.  Edema: Trace  Mental Status: Normal mood and affect. Normal behavior. Normal judgment and thought content.   Assessment and Plan:  Pregnancy: G1P0 at [redacted]w[redacted]d 1. Encounter for supervision of normal first pregnancy in third trimester - Doing well, reports frequent and vigorous fetal movement - Fetal movement present on exam today, vertex fetal position   2. [redacted] weeks gestation of pregnancy - Routine follow-up  - Discussed labor precautions and preparation for onset of labor. Reminded to research desired pediatrician.   Term labor symptoms and general obstetric precautions including but not limited to vaginal bleeding, contractions, leaking of fluid and fetal  movement were reviewed in detail with the patient. Please refer to After Visit Summary for other counseling recommendations.   No follow-ups on file.  Future Appointments  Date Time Provider [place]  02/28/2022  2:50 PM Gavin Pound, CNM [facility] None  03/08/2022  2:50 PM Laury Deep, CNM [place] None    Johnston Ebbs, NP

## 2022-02-28 ENCOUNTER — Ambulatory Visit (INDEPENDENT_AMBULATORY_CARE_PROVIDER_SITE_OTHER): Payer: Medicaid Other | Admitting: Obstetrics and Gynecology

## 2022-02-28 VITALS — BP 123/79 | HR 94 | Wt 209.0 lb

## 2022-02-28 DIAGNOSIS — Z3A38 38 weeks gestation of pregnancy: Secondary | ICD-10-CM

## 2022-02-28 DIAGNOSIS — Z3403 Encounter for supervision of normal first pregnancy, third trimester: Secondary | ICD-10-CM

## 2022-02-28 DIAGNOSIS — Z3401 Encounter for supervision of normal first pregnancy, first trimester: Secondary | ICD-10-CM

## 2022-02-28 DIAGNOSIS — Z3A39 39 weeks gestation of pregnancy: Secondary | ICD-10-CM

## 2022-02-28 NOTE — Progress Notes (Signed)
Pt reports good fetal movement with pressure.

## 2022-03-04 ENCOUNTER — Encounter: Payer: Self-pay | Admitting: Obstetrics and Gynecology

## 2022-03-04 NOTE — Progress Notes (Signed)
   LOW-RISK PREGNANCY OFFICE VISIT Patient name: Cindy Sellers MRN [id number]  Date of birth: 09/15/2000 Chief Complaint:   Routine Prenatal Visit  History of Present Illness:   Cindy Sellers is a 21 y.o. G1P0 female at [redacted]w[redacted]d with an Estimated Date of Delivery: 03/08/22 being seen today for ongoing management of a low-risk pregnancy.  Today she reports occasional contractions. Contractions: Not present. Vag. Bleeding: None.  Movement: Present. denies leaking of fluid. Review of Systems:   Pertinent items are noted in HPI Denies abnormal vaginal discharge w/ itching/odor/irritation, headaches, visual changes, shortness of breath, chest pain, abdominal pain, severe nausea/vomiting, or problems with urination or bowel movements unless otherwise stated above. Pertinent History Reviewed:  Reviewed past medical,surgical, social, obstetrical and family history.  Reviewed problem list, medications and allergies. Physical Assessment:   Vitals:   02/28/22 1456  BP: 123/79  Pulse: 94  Weight: 209 lb (94.8 kg)  Body mass index is 35.87 kg/m.        Physical Examination:   General appearance: Well appearing, and in no distress  Mental status: Alert, oriented to person, place, and time  Skin: Warm & dry  Cardiovascular: Normal heart rate noted  Respiratory: Normal respiratory effort, no distress  Abdomen: Soft, gravid, nontender  Pelvic: Cervical exam deferred         Extremities: Edema: Trace  Fetal Status: Fetal Heart Rate (bpm): 153 Fundal Height: 39 cm Movement: Present Presentation: Vertex  No results found for this or any previous visit (from the past 24 hour(s)).  Assessment & Plan:  1) Low-risk pregnancy G1P0 at [redacted]w[redacted]d with an Estimated Date of Delivery: 03/08/22   2) Encounter for supervision of normal first pregnancy in first trimester - Planning waterbirth - Discussed s/sx's of labor   3) [redacted] weeks gestation of pregnancy    Meds: No orders of the defined types were  placed in this encounter.  Labs/procedures today: none  Plan:  Continue routine obstetrical care   Reviewed: Term labor symptoms and general obstetric precautions including but not limited to vaginal bleeding, contractions, leaking of fluid and fetal movement were reviewed in detail with the patient.  All questions were answered. Has home bp cuff. Check bp weekly, let us know if >140/90.   Follow-up: Return in about 1 week (around 03/07/2022) for Return OB visit.  No orders of the defined types were placed in this encounter.  Laury Deep MSN, CNM 02/28/2022

## 2022-03-08 ENCOUNTER — Ambulatory Visit (INDEPENDENT_AMBULATORY_CARE_PROVIDER_SITE_OTHER): Payer: Medicaid Other | Admitting: Obstetrics and Gynecology

## 2022-03-08 ENCOUNTER — Encounter: Payer: Self-pay | Admitting: Obstetrics and Gynecology

## 2022-03-08 VITALS — BP 112/75 | HR 77 | Wt 213.0 lb

## 2022-03-08 DIAGNOSIS — Z3403 Encounter for supervision of normal first pregnancy, third trimester: Secondary | ICD-10-CM

## 2022-03-08 DIAGNOSIS — Z3A40 40 weeks gestation of pregnancy: Secondary | ICD-10-CM

## 2022-03-08 NOTE — Progress Notes (Signed)
   LOW-RISK PREGNANCY OFFICE VISIT Patient name: Cindy Sellers MRN [id number]  Date of birth: 2001/01/18 Chief Complaint:   Routine Prenatal Visit  History of Present Illness:   Cindy Sellers is a 21 y.o. G1P0 female at [redacted]w[redacted]d with an Estimated Date of Delivery: 03/08/22 being seen today for ongoing management of a low-risk pregnancy.  Today she reports occasional contractions. Contractions: Not present. Vag. Bleeding: None.  Movement: Present. denies leaking of fluid. Review of Systems:   Pertinent items are noted in HPI Denies abnormal vaginal discharge w/ itching/odor/irritation, headaches, visual changes, shortness of breath, chest pain, abdominal pain, severe nausea/vomiting, or problems with urination or bowel movements unless otherwise stated above. Pertinent History Reviewed:  Reviewed past medical,surgical, social, obstetrical and family history.  Reviewed problem list, medications and allergies. Physical Assessment:   Vitals:   03/08/22 1519  BP: 112/75  Pulse: 77  Weight: 213 lb (96.6 kg)  Body mass index is 36.56 kg/m.        Physical Examination:   General appearance: Well appearing, and in no distress  Mental status: Alert, oriented to person, place, and time  Skin: Warm & dry  Cardiovascular: Normal heart rate noted  Respiratory: Normal respiratory effort, no distress  Abdomen: Soft, gravid, nontender  Pelvic: Offered membrane sweeping, discussed r/b- pt decided to proceed, so membranes swept. Procedure tolerated well. Cervical exam performed Dilation: 1.5 Effacement (%): 70 Station: -2  Extremities: Edema: Trace  Fetal Status: Fetal Heart Rate (bpm): 139 Fundal Height: 40 cm Movement: Present Presentation: Vertex    Assessment & Plan:  1) Low-risk pregnancy G1P0 at [redacted]w[redacted]d with an Estimated Date of Delivery: 03/08/22   2) Encounter for supervision of normal first pregnancy in third trimester -  3) [redacted] weeks gestation of pregnancy    Meds: No orders of  the defined types were placed in this encounter.  Labs/procedures today: cervical exam & membrane sweep  Plan:  Continue routine obstetrical care   Reviewed: Term labor symptoms and general obstetric precautions including but not limited to vaginal bleeding, contractions, leaking of fluid and fetal movement were reviewed in detail with the patient.  All questions were answered. Has home bp cuff. Check bp weekly, let us know if >140/90.   Follow-up: Return in about 1 week (around 03/15/2022) for Return OB visit.  No orders of the defined types were placed in this encounter.  Laury Deep MSN, CNM 03/08/2022 3:36 PM

## 2022-03-12 ENCOUNTER — Inpatient Hospital Stay (HOSPITAL_COMMUNITY): Payer: Medicaid Other | Admitting: Anesthesiology

## 2022-03-12 ENCOUNTER — Inpatient Hospital Stay (HOSPITAL_COMMUNITY)
Admission: AD | Admit: 2022-03-12 | Discharge: 2022-03-15 | DRG: 807 | Disposition: A | Discharge status: Home / Self Care | Payer: Medicaid Other | Attending: Obstetrics and Gynecology | Admitting: Obstetrics and Gynecology

## 2022-03-12 ENCOUNTER — Encounter (HOSPITAL_COMMUNITY): Payer: Self-pay | Admitting: Obstetrics and Gynecology

## 2022-03-12 DIAGNOSIS — O480 Post-term pregnancy: Principal | ICD-10-CM | POA: Diagnosis present

## 2022-03-12 DIAGNOSIS — Z3A40 40 weeks gestation of pregnancy: Secondary | ICD-10-CM | POA: Diagnosis not present

## 2022-03-12 DIAGNOSIS — Z3401 Encounter for supervision of normal first pregnancy, first trimester: Secondary | ICD-10-CM

## 2022-03-12 LAB — TYPE AND SCREEN
ABO/RH(D): O POS
Antibody Screen: NEGATIVE

## 2022-03-12 LAB — CBC
HCT: 35.9 % — ABNORMAL LOW (ref 36.0–46.0)
Hemoglobin: 12.1 g/dL (ref 12.0–15.0)
MCH: 26.8 pg (ref 26.0–34.0)
MCHC: 33.7 g/dL (ref 30.0–36.0)
MCV: 79.6 fL — ABNORMAL LOW (ref 80.0–100.0)
Platelets: 326 10*3/uL (ref 150–400)
RBC: 4.51 MIL/uL (ref 3.87–5.11)
RDW: 13.5 % (ref 11.5–15.5)
WBC: 14.2 10*3/uL — ABNORMAL HIGH (ref 4.0–10.5)
nRBC: 0 % (ref 0.0–0.2)

## 2022-03-12 LAB — RPR: RPR Ser Ql: NONREACTIVE

## 2022-03-12 MED ORDER — LIDOCAINE-EPINEPHRINE (PF) 1.5 %-1:200000 IJ SOLN
INTRAMUSCULAR | Status: DC | PRN
  Administered 2022-03-12 (×2): 5 mL via EPIDURAL

## 2022-03-12 MED ORDER — FLEET ENEMA 7-19 GM/118ML RE ENEM: 1.0000 | ENEMA | RECTAL | Status: DC | PRN

## 2022-03-12 MED ORDER — LACTATED RINGERS IV SOLN: INTRAVENOUS | Status: DC

## 2022-03-12 MED ORDER — LACTATED RINGERS IV SOLN: 500.0000 mL | INTRAVENOUS | Status: DC | PRN

## 2022-03-12 MED ORDER — FENTANYL CITRATE (PF) 100 MCG/2ML IJ SOLN
50.0000 ug | INTRAMUSCULAR | Status: DC | PRN
  Administered 2022-03-12 (×2): 100 ug via INTRAVENOUS
  Filled 2022-03-12 (×2): qty 2

## 2022-03-12 MED ORDER — LACTATED RINGERS IV SOLN: 500.0000 mL | Freq: Once | INTRAVENOUS | Status: DC

## 2022-03-12 MED ORDER — PHENYLEPHRINE 80 MCG/ML (10ML) SYRINGE FOR IV PUSH (FOR BLOOD PRESSURE SUPPORT)
80.0000 ug | PREFILLED_SYRINGE | INTRAVENOUS | Status: DC | PRN
  Administered 2022-03-12: 80 ug via INTRAVENOUS

## 2022-03-12 MED ORDER — SOD CITRATE-CITRIC ACID 500-334 MG/5ML PO SOLN: 30.0000 mL | ORAL | Status: DC | PRN

## 2022-03-12 MED ORDER — PHENYLEPHRINE 80 MCG/ML (10ML) SYRINGE FOR IV PUSH (FOR BLOOD PRESSURE SUPPORT)
80.0000 ug | PREFILLED_SYRINGE | INTRAVENOUS | Status: DC | PRN
  Filled 2022-03-12: qty 10

## 2022-03-12 MED ORDER — DIPHENHYDRAMINE HCL 50 MG/ML IJ SOLN: 12.5000 mg | INTRAMUSCULAR | Status: DC | PRN

## 2022-03-12 MED ORDER — OXYCODONE-ACETAMINOPHEN 5-325 MG PO TABS: 1.0000 | ORAL_TABLET | ORAL | Status: DC | PRN

## 2022-03-12 MED ORDER — LIDOCAINE HCL (PF) 1 % IJ SOLN
INTRAMUSCULAR | Status: DC | PRN
  Administered 2022-03-12: 8 mL via EPIDURAL

## 2022-03-12 MED ORDER — LIDOCAINE HCL (PF) 1 % IJ SOLN: 30.0000 mL | INTRAMUSCULAR | Status: DC | PRN

## 2022-03-12 MED ORDER — ACETAMINOPHEN 325 MG PO TABS: 650.0000 mg | ORAL_TABLET | ORAL | Status: DC | PRN

## 2022-03-12 MED ORDER — EPHEDRINE 5 MG/ML INJ: 10.0000 mg | INTRAVENOUS | Status: DC | PRN

## 2022-03-12 MED ORDER — OXYTOCIN BOLUS FROM INFUSION
333.0000 mL | Freq: Once | INTRAVENOUS | Status: AC
  Administered 2022-03-13: 333 mL via INTRAVENOUS

## 2022-03-12 MED ORDER — FENTANYL-BUPIVACAINE-NACL 0.5-0.125-0.9 MG/250ML-% EP SOLN
12.0000 mL/h | EPIDURAL | Status: DC | PRN
  Administered 2022-03-12: 12 mL/h via EPIDURAL
  Filled 2022-03-12: qty 250

## 2022-03-12 MED ORDER — OXYCODONE-ACETAMINOPHEN 5-325 MG PO TABS: 2.0000 | ORAL_TABLET | ORAL | Status: DC | PRN

## 2022-03-12 MED ORDER — ONDANSETRON HCL 4 MG/2ML IJ SOLN: 4.0000 mg | Freq: Four times a day (QID) | INTRAMUSCULAR | Status: DC | PRN

## 2022-03-12 MED ORDER — OXYTOCIN-SODIUM CHLORIDE 30-0.9 UT/500ML-% IV SOLN
2.5000 [IU]/h | INTRAVENOUS | Status: DC
  Administered 2022-03-13: 2.5 [IU]/h via INTRAVENOUS
  Filled 2022-03-12 (×2): qty 500

## 2022-03-12 NOTE — Progress Notes (Signed)
Patient ID: Cindy Sellers, female   DOB: 09-Oct-2000, 21 y.o.   MRN: [id number]  Cindy Sellers MRN: [id number]  Subjective: -Nurse reports WB tub out of commission.  Provider to bedside to discuss with patient who is in shower.  Patient endorses understanding and denies needs at current.  SO and cousin at bedside.   Objective: BP (!) 140/84   Pulse 99   Temp 98.2 F (36.8 C) (Oral)   Resp 20   Ht 5\' 2"  (1.575 m)   Wt 96.7 kg   LMP 06/01/2021   SpO2 96%   BMI 38.98 kg/m  No intake/output data recorded. No intake/output data recorded.  Fetal Monitoring: FHT:   Vaginal Exam: SVE:   Dilation: 5.5 Effacement (%): 90 Station: -2 Exam by:: JEmly,cnm Membranes:Intact Internal Monitors: None  Augmentation/Induction: Pitocin:None Cytotec: None  Assessment:  IUP at 40.4 weeks Desires WB, but Unavailable   Plan: -Patient states she is considering an epidural. -If epidural placed, back to standard monitoring. -Mildly elevated BP x one.  Will have nurse retake once patient more relaxed. If remains elevated will complete labs.  -Continue other mgmt as ordered  Riley Churches, CNM [facility] Provider, [facility] 03/12/2022, 11:36 AM

## 2022-03-12 NOTE — Progress Notes (Signed)
Labor Progress Note Cindy Sellers is a 21 y.o. G1P0 at [redacted]w[redacted]d presented for SOL.  S: Patient is resting comfortably. No complaints. Epidural functioning well.   O:  BP 106/84   Pulse (!) 128   Temp 99.3 F (37.4 C) (Axillary)   Resp 16   Ht 5\' 2"  (1.575 m)   Wt 96.7 kg   LMP 06/01/2021   SpO2 100%   BMI 38.98 kg/m  EFM: 10 minute stretch minimal variability, good variability prior/accels present/no decels  CVE: Dilation: 8 Effacement (%): 100 Cervical Position: Middle Station: -1 Presentation: Vertex Exam by:: Legrand Como, Resident   A&P: 21 y.o. G1P0 [redacted]w[redacted]d admitted for SOL.  #Labor: Progressing well, making cervical change. AROM 2032 with clear fluid. Making progress without additional augmentation but limited contractions on tocometer. Consider adding pitocin if not progressing at next check in 2 hours.  #Pain: controlled, epidural #FWB: category 1 #GBS negative  Cecilio Asper, MD [facility] 8:56 PM

## 2022-03-12 NOTE — Anesthesia Preprocedure Evaluation (Signed)
Anesthesia Evaluation  Patient identified by MRN, date of birth, ID band Patient awake    Reviewed: Allergy & Precautions, NPO status , Patient's Chart, lab work & pertinent test results  Airway Mallampati: II  TM Distance: >3 FB Neck ROM: Full    Dental no notable dental hx.    Pulmonary neg pulmonary ROS,    Pulmonary exam normal        Cardiovascular negative cardio ROS   Rhythm:Regular Rate:Normal     Neuro/Psych negative neurological ROS  negative psych ROS   GI/Hepatic negative GI ROS, Neg liver ROS,   Endo/Other  negative endocrine ROS  Renal/GU negative Renal ROS  negative genitourinary   Musculoskeletal negative musculoskeletal ROS (+)   Abdominal Normal abdominal exam  (+)   Peds  Hematology negative hematology ROS (+)   Anesthesia Other Findings   Reproductive/Obstetrics (+) Pregnancy                             Anesthesia Physical Anesthesia Plan  ASA: 2  Anesthesia Plan: Epidural   Post-op Pain Management:    Induction:   PONV Risk Score and Plan: 2 and Treatment may vary due to age or medical condition  Airway Management Planned: Natural Airway  Additional Equipment: None  Intra-op Plan:   Post-operative Plan:   Informed Consent: I have reviewed the patients History and Physical, chart, labs and discussed the procedure including the risks, benefits and alternatives for the proposed anesthesia with the patient or authorized representative who has indicated his/her understanding and acceptance.     Dental advisory given  Plan Discussed with:   Anesthesia Plan Comments:         Anesthesia Quick Evaluation

## 2022-03-12 NOTE — Progress Notes (Signed)
Labor Progress Note Cindy Sellers is a 21 y.o. G1P0 at [redacted]w[redacted]d presented for SOL.  S: Patient is resting comfortably. Epidural working well. Feeling some pressure, not feeling contractions.   O:  BP 132/74   Pulse 98   Temp 99.3 F (37.4 C) (Axillary)   Resp 16   Ht 5\' 2"  (1.575 m)   Wt 96.7 kg   LMP 06/01/2021   SpO2 100%   BMI 38.98 kg/m  EFM: 150/good variability/no decels  CVE: Dilation: Lip/rim (anterior lip) Effacement (%): 100 Cervical Position: Middle Station: 0, -1 Presentation: Vertex Exam by:: Cecilio Asper, MD   A&P: 21 y.o. G1P0 [redacted]w[redacted]d here with SOL.  #Labor: Progressing well. From 8cm to anterior lip after AROM. Will have her sit in throne, recheck in 2 hours. If contractions slow and not progressing could start pitocin.  #Pain: controlled with epidural #FWB: category 1 #GBS negative  Cecilio Asper, [place] [facility] [place] 10:47 PM

## 2022-03-12 NOTE — Anesthesia Procedure Notes (Signed)
Epidural Patient location during procedure: OB Start time: 03/12/2022 3:50 PM End time: 03/12/2022 4:00 PM  Staffing Anesthesiologist: Darral Dash, DO Performed: anesthesiologist   Preanesthetic Checklist Completed: patient identified, IV checked, site marked, risks and benefits discussed, surgical consent, monitors and equipment checked, pre-op evaluation and timeout performed  Epidural Patient position: sitting Prep: ChloraPrep Patient monitoring: heart rate, continuous pulse ox and blood pressure Approach: midline Location: L3-L4 Injection technique: LOR saline  Needle:  Needle type: Tuohy  Needle gauge: 17 G Needle length: 9 cm Needle insertion depth: 8 cm Catheter type: closed end flexible Catheter size: 20 Guage Catheter at skin depth: 14 cm Test dose: negative and 1.5% lidocaine with Epi 1:200 K  Assessment Events: blood not aspirated, injection not painful, no injection resistance and no paresthesia  Additional Notes   Patient identified. Risks/Benefits/Options discussed with patient including but not limited to bleeding, infection, nerve damage, paralysis, failed block, incomplete pain control, headache, blood pressure changes, nausea, vomiting, reactions to medications, itching and postpartum back pain. Confirmed with bedside nurse the patient's most recent platelet count. Confirmed with patient that they are not currently taking any anticoagulation, have any bleeding history or any family history of bleeding disorders. Patient expressed understanding and wished to proceed. All questions were answered. Sterile technique was used throughout the entire procedure. Please see nursing notes for vital signs. Test dose was given through epidural catheter and negative prior to continuing to dose epidural or start infusion. Warning signs of high block given to the patient including shortness of breath, tingling/numbness in hands, complete motor block, or any concerning  symptoms with instructions to call for help. Patient was given instructions on fall risk and not to get out of bed. All questions and concerns addressed with instructions to call with any issues or inadequate analgesia.    Reason for block:procedure for pain

## 2022-03-12 NOTE — MAU Note (Signed)
..  Cindy Sellers is a 21 y.o. at [redacted]w[redacted]d here in MAU reporting: contractions that are now 5 minutes apart. Denies vaginal bleeding or leaking of fluid. +FM.   Onset of complaint: today Pain score: 8/10 Vitals:   03/12/22 0149  BP: 122/79  Pulse: 81  Resp: 18  Temp: 97.6 F (36.4 C)  SpO2: 99%     FHT:143 Lab orders placed from triage:  none Last SVE on 03/08/2022: 1cm with membrane sweep

## 2022-03-12 NOTE — Progress Notes (Signed)
Patient ID: Cindy Sellers, female   DOB: 2001-01-25, 21 y.o.   MRN: [id number]  Nickol Collister MRN: [id number]  Subjective: -Care assumed of 21 y.o. G1P0 at [redacted]w[redacted]d who presents for active labor. In room to meet acquaintance of patient and family.  Patient coping well with contractions using birthing ball and nitrous.   Objective: BP 128/75   Pulse 86   Temp 98.4 F (36.9 C) (Oral)   Resp 20   Ht 5\' 2"  (1.575 m)   Wt 96.7 kg   LMP 06/01/2021   SpO2 96%   BMI 38.98 kg/m  No intake/output data recorded. No intake/output data recorded.  Fetal Monitoring: FHT: 130 by doppler UC: Palpates moderate    Physical Exam: General appearance: oriented to person, place, and time and coping well with contractions. Chest: not examined.  not examined. Abdominal exam: Soft RT, Ctx palpate moderate. Extremities: Mild edema Skin exam: Warm Dry  Vaginal Exam: SVE:   Dilation: 5.5 Effacement (%): 90 Station: -2 Exam by:: JEmly,cnm Membranes:Intact Internal Monitors: None  Augmentation/Induction: Pitocin:None Cytotec: None  Assessment:  IUP at 40.4 weeks Desires WB GBS Negative   Plan: -WB risks and benefits discussed.  -Consent signed. -Patient desires to use birthing ball and nitrous at the time. -Cautioned that other patient's desiring WB and tub may not be available. -Patient verbalizes understanding. -Doppler with good FHR and Accel noted during contraction.  -Continue other mgmt as ordered   Riley Churches, CNM 03/12/2022, 9:13 AM

## 2022-03-12 NOTE — Progress Notes (Signed)
Patient ID: Cindy Sellers, female   DOB: 04-09-2001, 21 y.o.   MRN: [id number]  Subjective: -Patient s/p epidural. Provider to bedside and patient asleep.  Did not attempt to awaken. Strip and chart reviewed.   Objective: BP 125/76   Pulse 89   Temp 97.8 F (36.6 C) (Axillary)   Resp 20   Ht 5\' 2"  (1.575 m)   Wt 96.7 kg   LMP 06/01/2021   SpO2 100%   BMI 38.98 kg/m  No intake/output data recorded. Total I/O In: 401 [I.V.:853] Out: -   Fetal Monitoring: FHT: 140 bpm, Mod Var, -Decels, +Accels UC: Q50min    Vaginal Exam: SVE:   Dilation: 6 Effacement (%): 100 Station: 0 Exam by:: rebecca zhang,rnc-ob Membranes:Intact Internal Monitors: None  Augmentation/Induction: Pitocin:None Cytotec: None  Assessment:  IUP at 40.4 weeks Cat I FT  Active Labor  Plan: -Will allow time to rest. -Plan to AROM with next cervical exam. -Continue other mgmt as ordered   Riley Churches, [place] Provider, [facility] 03/12/2022, 5:57 PM

## 2022-03-12 NOTE — H&P (Cosign Needed Addendum)
OBSTETRIC ADMISSION HISTORY AND PHYSICAL  Cindy Sellers is a 21 y.o. female G27P0 with IUP at 66w4dby early UKoreapresenting for increasing intensity/frequency of contractions. She reports +FMs, No LOF, no VB, no blurry vision, headaches or peripheral edema, and RUQ pain.  She plans on breast feeding. She is desires OCP for birth control. She received her prenatal care at  [facility]    Dating: By UKorea2/28/23 --->  Estimated Date of Delivery: 03/08/22  Sono:    _0 , CWD, normal anatomy, cephalic presentation, 5765Y 21%% EFW   Prenatal History/Complications:          Nursing Staff Provider  Office Location [facility] Dating   U/S  [facility] [Valu.Nieves] Traditional _1  Centering _2  Mom-Baby Dyad      Language  English Anatomy [place]  completed  Flu Vaccine  Declined 4/4 Genetic/Carrier Screen  NIPS:   LR AFP:   negative Horizon: Neg  TDaP Vaccine   Received   12/20/2021 Hgb A1C or  GTT Early  Third trimester nl 2 hour  COVID Vaccine Not vaccinated   LAB RESULTS   Rhogam    Blood Type O/Positive/-- (04/24 1551) O POSITIVE  Baby Feeding Plan Breast Antibody Negative (04/24 1551)negative  Contraception Undecided Rubella 3.50 (04/24 1551)immune  Circumcision Yes if a boy RPR Non Reactive (07/26 1045) non-reactive  Pediatrician  Undecided HBsAg Negative (04/24 1551) negative  Support Person FOB HCVAb non- reactive  Prenatal Classes   HIV Non Reactive (07/26 1045) negative  BTL Consent   GBS Negative/-- (09/20 1526)negative (For PCN allergy, check sensitivities)   VBAC Consent   Pap DONE 08/2021- NILM           DME Rx [Valu.Nieves] BP cuff [Valu.Nieves] Weight Scale WDoren Custard [Valu.Nieves] Class _3  Consent [Valu.Nieves] CNM visit  [place] new OB [ X ] 28 weeks  [ x ] 36 weeks Induction  _4  Orders Entered _5 Foley Y/N     Past Medical History: Past Medical History:  Diagnosis Date   Medical history non-contributory     Past Surgical History: Past Surgical History:  Procedure Laterality Date    TONSILLECTOMY      Obstetrical History: OB History     Gravida  1   Para      Term      Preterm      AB      Living         SAB      IAB      Ectopic      Multiple      Live Births              Social History Social History   Socioeconomic History   Marital status: Single    Spouse name: Not on file   Number of children: Not on file   Years of education: Not on file   Highest education level: Not on file  Occupational History   Not on file  Tobacco Use   Smoking status: Never   Smokeless tobacco: Never  Vaping Use   Vaping Use: Former   Quit date: 06/08/2021   Substances: Nicotine, Flavoring  Substance and Sexual Activity   Alcohol use: Not Currently    Comment: not since confirmed pregnancy   Drug use: Not Currently    Types: Marijuana    Comment: not since confirmed pregnancy   Sexual activity: Yes  Partners: Male    Birth control/protection: None  Other Topics Concern   Not on file  Social History Narrative   Not on file   Social Determinants of Health   Financial Resource Strain: Not on file  Food Insecurity: Not on file  Transportation Needs: Not on file  Physical Activity: Not on file  Stress: Not on file  Social Connections: Not on file    Family History: Family History  Problem Relation Age of Onset   Aneurysm Father    COPD Maternal Grandmother    Heart disease Maternal Grandmother    Diabetes Maternal Grandfather     Allergies: No Known Allergies  Medications Prior to Admission  Medication Sig Dispense Refill Last Dose   Prenatal Vit-Fe Fumarate-FA (MULTIVITAMIN-PRENATAL) 27-0.8 MG TABS tablet Take 1 tablet by mouth daily at 12 noon.   03/11/2022   Blood Pressure Monitoring (BLOOD PRESSURE KIT) DEVI 1 kit by Does not apply route once a week. (Patient not taking: Reported on 12/20/2021) 1 each 0    Misc. Devices (GOJJI WEIGHT SCALE) MISC 1 Device by Does not apply route every 30 (thirty) days. (Patient not taking:  Reported on 12/20/2021) 1 each 0      Review of Systems   All systems reviewed and negative except as stated in HPI  Blood pressure 127/78, pulse 86, temperature 97.6 F (36.4 C), temperature source Oral, resp. rate 18, height _0  (1.575 m), weight 96.7 kg, last menstrual period 06/01/2021, SpO2 99 %. General appearance: alert, cooperative, and mild distress Lungs: clear to auscultation bilaterally Heart: regular rate and rhythm Abdomen: soft, non-tender; bowel sounds normal Extremities: Homans sign is negative, no sign of DVT Presentation: cephalic Fetal monitoring Baseline: 145 bpm Uterine activityFrequency: Every 4-6 minutes Dilation: 4 Effacement (%): 90 Station: -2 Exam by:: weston,rn   Prenatal labs: ABO, Rh: --/--/PENDING (10/16 0527) Antibody: PENDING (10/16 0527) Rubella: 3.50 (04/24 1551) RPR: Non Reactive (07/26 1045)  HBsAg: Negative (04/24 1551)  HIV: Non Reactive (07/26 1045)  GBS: Negative/-- (09/20 1526)  2 hr Glucola 81/167/142 Genetic screening  NIPS:LR/AFP:negative/Horizon:Neg Anatomy US Normal  Prenatal Transfer Tool  Maternal Diabetes: No Genetic Screening: Normal Maternal Ultrasounds/Referrals: Normal Fetal Ultrasounds or other Referrals:  None Maternal Substance Abuse:  No Significant Maternal Medications:  None Significant Maternal Lab Results:  Group B Strep negative Number of Prenatal Visits:greater than 3 verified prenatal visits Other Comments:   Requests water birth, took class and had prenatal visits with midwife  Results for orders placed or performed during the hospital encounter of 03/12/22 (from the past 24 hour(s))  CBC   Collection Time: 03/12/22  5:27 AM  Result Value Ref Range   WBC 14.2 (H) 4.0 - 10.5 K/uL   RBC 4.51 3.87 - 5.11 MIL/uL   Hemoglobin 12.1 12.0 - 15.0 g/dL   HCT 35.9 (L) 36.0 - 46.0 %   MCV 79.6 (L) 80.0 - 100.0 fL   MCH 26.8 26.0 - 34.0 pg   MCHC 33.7 30.0 - 36.0 g/dL   RDW 13.5 11.5 - 15.5 %   Platelets  326 150 - 400 K/uL   nRBC 0.0 0.0 - 0.2 %  Type and screen [place]   Collection Time: 03/12/22  5:27 AM  Result Value Ref Range   ABO/RH(D) PENDING    Antibody Screen PENDING    Sample Expiration      03/15/2022,2359 Performed at [facility], [address], [place], [place] [address]  Patient Active Problem List   Diagnosis Date Noted   Post-dates pregnancy 03/12/2022   Encounter for supervision of normal first pregnancy in first trimester 08/29/2021    Assessment/Plan:  Cindy Sellers is a 21 y.o. G1P0 at 80w4dhere for contractions.  #Labor: Admit to L&D, will reassess and augment with AROM prn  #Pain: Using nitrous, desires water birth. #FWB: Cat 1 #ID: GBS negative #MOF: Breast #MOC: OCP #Circ:  Yes  KHollace Hayward Student-MidWife  03/12/2022, 5:56 AM  CNM attestation:  I have seen and examined this patient; I agree with above documentation in the student nurse midwife's note.   Cindy Lauschis a 21y.o. G1P0 here for latent labor  PE: BP 136/81   Pulse 84   Temp 98.5 F (36.9 C) (Oral)   Resp 20   Ht _0  (1.575 m)   Wt 96.7 kg   LMP 06/01/2021   SpO2 99%   BMI 38.98 kg/m  Gen: breathing well w ctx Resp: normal effort, no distress Abd: gravid  ROS, labs, PMH reviewed  Plan: -Admit to Labor and Delivery -Expectant management -Interested in wMilliganas labor progresses (one tub available; will sign consent to have it ready as there are no current risk-out conditions); will use IA, shower, and nitrous for now -Anticipate vag del  KStony Prairie10/16/2023, 6:53 AM

## 2022-03-13 ENCOUNTER — Encounter (HOSPITAL_COMMUNITY): Payer: Self-pay | Admitting: Obstetrics and Gynecology

## 2022-03-13 DIAGNOSIS — O480 Post-term pregnancy: Secondary | ICD-10-CM

## 2022-03-13 DIAGNOSIS — Z3A40 40 weeks gestation of pregnancy: Secondary | ICD-10-CM

## 2022-03-13 MED ORDER — OXYCODONE HCL 5 MG PO TABS: 5.0000 mg | ORAL_TABLET | ORAL | Status: DC | PRN

## 2022-03-13 MED ORDER — ONDANSETRON HCL 4 MG PO TABS: 4.0000 mg | ORAL_TABLET | ORAL | Status: DC | PRN

## 2022-03-13 MED ORDER — ACETAMINOPHEN 325 MG PO TABS: 650.0000 mg | ORAL_TABLET | ORAL | Status: DC | PRN

## 2022-03-13 MED ORDER — DIPHENHYDRAMINE HCL 25 MG PO CAPS: 25.0000 mg | ORAL_CAPSULE | Freq: Four times a day (QID) | ORAL | Status: DC | PRN

## 2022-03-13 MED ORDER — TETANUS-DIPHTH-ACELL PERTUSSIS 5-2.5-18.5 LF-MCG/0.5 IM SUSY: 0.5000 mL | PREFILLED_SYRINGE | Freq: Once | INTRAMUSCULAR | Status: DC

## 2022-03-13 MED ORDER — OXYTOCIN-SODIUM CHLORIDE 30-0.9 UT/500ML-% IV SOLN
1.0000 m[IU]/min | INTRAVENOUS | Status: DC
  Administered 2022-03-13: 2 m[IU]/min via INTRAVENOUS

## 2022-03-13 MED ORDER — BENZOCAINE-MENTHOL 20-0.5 % EX AERO
1.0000 | INHALATION_SPRAY | CUTANEOUS | Status: DC | PRN
  Administered 2022-03-13: 1 via TOPICAL
  Filled 2022-03-13: qty 56

## 2022-03-13 MED ORDER — COCONUT OIL OIL: 1.0000 | TOPICAL_OIL | Status: DC | PRN

## 2022-03-13 MED ORDER — SIMETHICONE 80 MG PO CHEW: 80.0000 mg | CHEWABLE_TABLET | ORAL | Status: DC | PRN

## 2022-03-13 MED ORDER — SENNOSIDES-DOCUSATE SODIUM 8.6-50 MG PO TABS
2.0000 | ORAL_TABLET | Freq: Every day | ORAL | Status: DC
  Administered 2022-03-14 – 2022-03-15 (×2): 2 via ORAL
  Filled 2022-03-13 (×2): qty 2

## 2022-03-13 MED ORDER — IBUPROFEN 600 MG PO TABS
600.0000 mg | ORAL_TABLET | Freq: Four times a day (QID) | ORAL | Status: DC
  Administered 2022-03-13 – 2022-03-15 (×8): 600 mg via ORAL
  Filled 2022-03-13 (×9): qty 1

## 2022-03-13 MED ORDER — OXYCODONE HCL 5 MG PO TABS: 10.0000 mg | ORAL_TABLET | ORAL | Status: DC | PRN

## 2022-03-13 MED ORDER — TERBUTALINE SULFATE 1 MG/ML IJ SOLN: 0.2500 mg | Freq: Once | INTRAMUSCULAR | Status: DC | PRN

## 2022-03-13 MED ORDER — PRENATAL MULTIVITAMIN CH
1.0000 | ORAL_TABLET | Freq: Every day | ORAL | Status: DC
  Administered 2022-03-13 – 2022-03-15 (×3): 1 via ORAL
  Filled 2022-03-13 (×3): qty 1

## 2022-03-13 MED ORDER — WITCH HAZEL-GLYCERIN EX PADS: 1.0000 | MEDICATED_PAD | CUTANEOUS | Status: DC | PRN

## 2022-03-13 MED ORDER — ZOLPIDEM TARTRATE 5 MG PO TABS: 5.0000 mg | ORAL_TABLET | Freq: Every evening | ORAL | Status: DC | PRN

## 2022-03-13 MED ORDER — DIBUCAINE (PERIANAL) 1 % EX OINT: 1.0000 | TOPICAL_OINTMENT | CUTANEOUS | Status: DC | PRN

## 2022-03-13 MED ORDER — ONDANSETRON HCL 4 MG/2ML IJ SOLN: 4.0000 mg | INTRAMUSCULAR | Status: DC | PRN

## 2022-03-13 NOTE — Lactation Note (Signed)
This note was copied from a baby's chart. Lactation Consultation Note  Patient Name: Cindy Sellers Date: 03/13/2022 Reason for consult: Initial assessment;Primapara;Term Age:22 hours Mom had baby on the breast when LC came into rm. [place] praised mom. LC adjusted body alignment and flange. Mom stated much better. Newborn feeding habits, STS, I&O, support reviewed. Mom encouraged to feed baby 8-12 times/24 hours and with feeding cues.   Answered the few questions mom had. Encouraged to call for further questions or assistance.  Maternal Data Does the patient have breastfeeding experience prior to this delivery?: No  Feeding    LATCH Score Latch: Grasps breast easily, tongue down, lips flanged, rhythmical sucking.  Audible Swallowing: A few with stimulation  Type of Nipple: Everted at rest and after stimulation (short shaft)  Comfort (Breast/Nipple): Soft / non-tender  Hold (Positioning): Assistance needed to correctly position infant at breast and maintain latch. (just adjusted body alignment)  LATCH Score: 8   Lactation Tools Discussed/Used    Interventions Interventions: Breast feeding basics reviewed;Adjust position;Support pillows;Assisted with latch;Skin to skin;Breast massage;Hand express;Breast compression;LC Services brochure  Discharge    Consult Status Consult Status: Follow-up Date: 03/14/22 Follow-up type: In-patient    Theodoro Kalata 03/13/2022, 7:52 PM

## 2022-03-13 NOTE — Lactation Note (Signed)
This note was copied from a baby's chart. Lactation Consultation Note  Patient Name: Cindy Sellers [id number] Date: 03/13/2022   Age:22 hours  LC Note:  Attempted to visit with family, however, many visitors present.  Will follow up at another time.   Maternal Data    Feeding    LATCH Score Latch: Grasps breast easily, tongue down, lips flanged, rhythmical sucking.  Audible Swallowing: Spontaneous and intermittent  Type of Nipple: Everted at rest and after stimulation  Comfort (Breast/Nipple): Soft / non-tender  Hold (Positioning): Full assist, staff holds infant at breast  [facility] Score: 8   Lactation Tools Discussed/Used    Interventions    Discharge    Consult Status      Cindy Sellers 03/13/2022, 2:28 PM

## 2022-03-13 NOTE — Discharge Summary (Signed)
Postpartum Discharge Summary  Date of Service updated 03/15/22      Patient Name: Cindy Sellers DOB: 03/09/2001 MRN: [id number]  Date of admission: 03/12/2022 Delivery date:03/13/2022  Delivering provider: MERCADO-ORTIZ, JESSICA Q  Date of discharge: 03/15/2022  Admitting diagnosis: Post-dates pregnancy [O48.0] Intrauterine pregnancy: [redacted]w[redacted]d     Secondary diagnosis:  Principal Problem:   Vaginal delivery Active Problems:   Encounter for supervision of normal first pregnancy in first trimester   Post-dates pregnancy  Additional problems: None    Discharge diagnosis: Term Pregnancy Delivered                                              Post partum procedures: NA Augmentation: AROM and Pitocin Complications: None  Hospital course: Onset of Labor With Vaginal Delivery      20 y.o. yo G1P0 at [redacted]w[redacted]d was admitted in Latent Labor on 03/12/2022. Labor course was complicated by nothing.  Membrane Rupture Time/Date: 8:32 PM ,03/12/2022   Delivery Method:Vaginal, Spontaneous  Episiotomy: None  Lacerations:  2nd degree  Patient had a postpartum course complicated by NA.  She is ambulating, tolerating a regular diet, passing flatus, and urinating well. Patient is discharged home in stable condition on 03/15/22.  Newborn Data: Birth date:03/13/2022  Birth time:5:15 AM  Gender:Female  Living status:Living  Apgars:9 ,9  Weight:3690 g   Magnesium Sulfate received: No BMZ received: No Rhophylac:N/A MMR:N/A T-DaP:Given prenatally Flu: No Transfusion:No  Physical exam  Vitals:   03/14/22 0530 03/14/22 1404 03/14/22 2025 03/15/22 0610  BP: 109/65 109/78 107/63 101/65  Pulse: 81 88 81 85  Resp: 18 19 16 18  Temp: 98.1 F (36.7 C) 98.3 F (36.8 C) 98 F (36.7 C) 98.1 F (36.7 C)  TempSrc: Oral Oral Oral Oral  SpO2: 100%  99% 100%  Weight:      Height:       General: alert Lochia: appropriate Uterine Fundus: firm Incision: N/A DVT Evaluation: No evidence of DVT seen  on physical exam. Labs: Lab Results  Component Value Date   WBC 14.2 (H) 03/12/2022   HGB 12.1 03/12/2022   HCT 35.9 (L) 03/12/2022   MCV 79.6 (L) 03/12/2022   PLT 326 03/12/2022       No data to display         Edinburgh Score:    03/14/2022    3:35 PM  Edinburgh Postnatal Depression Scale Screening Tool  I have been able to laugh and see the funny side of things. 0  I have looked forward with enjoyment to things. 0  I have blamed myself unnecessarily when things went wrong. 1  I have been anxious or worried for no good reason. 1  I have felt scared or panicky for no good reason. 1  Things have been getting on top of me. 1  I have been so unhappy that I have had difficulty sleeping. 1  I have felt sad or miserable. 1  I have been so unhappy that I have been crying. 1  The thought of harming myself has occurred to me. 0  Edinburgh Postnatal Depression Scale Total 7     After visit meds:  Allergies as of 03/15/2022   No Known Allergies      Medication List     TAKE these medications    acetaminophen 325 MG tablet Commonly known as: Tylenol Take 2   tablets (650 mg total) by mouth every 4 (four) hours as needed (for pain scale < 4).   ibuprofen 600 MG tablet Commonly known as: ADVIL Take 1 tablet (600 mg total) by mouth every 6 (six) hours.   PRENATAL PO Take 1 tablet by mouth daily.         Discharge home in stable condition Infant Feeding: Bottle and Breast Infant Disposition:home with mother Discharge instruction: per After Visit Summary and Postpartum booklet. Activity: Advance as tolerated. Pelvic rest for 6 weeks.  Diet: routine diet Future Appointments: Future Appointments  Date Time Provider Department Center  05/04/2022 10:15 AM Ervin, Michael L, MD [facility] None   Follow up Visit: Message sent to [facility] on 10/17  Please schedule this patient for a In person postpartum visit in 6 weeks with the following provider: Any  provider. Additional Postpartum F/U: n/a   Low risk pregnancy complicated by:  n/a Delivery mode:  Vaginal, Spontaneous  Anticipated Birth Control:  Unsure   03/15/2022 Claire Lockman, DO    

## 2022-03-13 NOTE — Lactation Note (Signed)
This note was copied from a baby's chart. Lactation Consultation Note  Patient Name: Cindy Sellers RUEAV'W Date: 03/13/2022   Age:22 hours   LC Note:  Second attempt to visit with family; room dark and all members asleep.  Will have night shift LC follow up.  RN updated.   Maternal Data    Feeding    LATCH Score                    Lactation Tools Discussed/Used    Interventions    Discharge    Consult Status      Cindy Sellers 03/13/2022, 6:38 PM

## 2022-03-14 ENCOUNTER — Other Ambulatory Visit: Payer: Self-pay

## 2022-03-14 NOTE — Lactation Note (Signed)
This note was copied from a baby's chart. Lactation Consultation Note  Patient Name: Cindy Sellers JMEQA'[id number] Date: 03/14/2022 Reason for consult: Follow-up assessment;Term;Primapara;1st time breastfeeding Age:22 hours   P1: Term infant at 40+5 weeks Feeding preference: Breast Weight loss: 3%  "Albertine Grates" was swaddled and asleep when I arrived.  He had a circumcision this morning.  Birth parent feels like she is getting "Albertine Grates" latched well.  Upon assessment, her breasts are soft and nipples are rounded and intact.  She denies pain with latching.  She stated that he has been sleepy since returning from his circumcision.  Reassured her that this is typical behavior and suggested lots of STS.  Offered to return at the next feeding for latch assistance if desired.  Last LATCH score was an 8; voiding/stooling.  Suggested the possibility of cluster feeding today/tonight.  Support person at bedside.   Maternal Data Has patient been taught Hand Expression?: Yes Does the patient have breastfeeding experience prior to this delivery?: No  Feeding Mother's Current Feeding Choice: Breast Milk  LATCH Score                    Lactation Tools Discussed/Used    Interventions Interventions: Breast feeding basics reviewed;Education  Discharge Pump:  (No pump; will be a "stay at home" mother)  Consult Status Consult Status: Follow-up Date: 03/15/22 Follow-up type: In-patient    Aishi Courts R [place] 03/14/2022, 1:42 PM

## 2022-03-14 NOTE — Anesthesia Postprocedure Evaluation (Signed)
Anesthesia Post Note  Patient: Cindy Sellers  Procedure(s) Performed: AN AD HOC LABOR EPIDURAL     Patient location during evaluation: Mother Baby Anesthesia Type: Epidural Level of consciousness: awake and alert Pain management: pain level controlled Vital Signs Assessment: post-procedure vital signs reviewed and stable Respiratory status: spontaneous breathing, nonlabored ventilation and respiratory function stable Cardiovascular status: stable Postop Assessment: no headache, no backache and epidural receding Anesthetic complications: no   No notable events documented.  Last Vitals:  Vitals:   03/14/22 0530 03/14/22 1404  BP: 109/65 109/78  Pulse: 81 88  Resp: 18 19  Temp: 36.7 C 36.8 C  SpO2: 100%     Last Pain:  Vitals:   03/14/22 1404  TempSrc: Oral  PainSc:    Pain Goal: Patients Stated Pain Goal: 0 (03/12/22 0150)                 Gilmer Mor

## 2022-03-14 NOTE — Progress Notes (Addendum)
Post Partum Day 1 Subjective: no complaints, up ad lib, voiding, tolerating PO, + flatus, and breast feeding well.  Still sore but pain mostly controlled. Bleeding is slowing down, lochia slightly more than menses.   Objective: Blood pressure 111/62, pulse 88, temperature 98 F (36.7 C), temperature source Oral, resp. rate 18, height 5\' 2"  (1.575 m), weight 96.7 kg, last menstrual period 06/01/2021, SpO2 100 %, unknown if currently breastfeeding.  Physical Exam:  General: alert, cooperative, and no distress Lochia: appropriate Uterine Fundus: firm DVT Evaluation: No evidence of DVT seen on physical exam. No cords or calf tenderness. No significant calf/ankle edema.  Recent Labs    03/12/22 0527  HGB 12.1  HCT 35.9*    Assessment/Plan: Plan for discharge tomorrow, Breastfeeding, Lactation consult, Circumcision prior to discharge, and Contraception POPs . Verbal consent obtained for circ, note placed in infant's chart.    LOS: 2 days   Cecilio Asper, MD 03/14/2022, 5:42 AM   GME ATTESTATION:  I saw and evaluated the patient. I agree with the findings and the plan of care as documented in the resident's note. I have made changes to documentation as necessary.  Gerlene Fee, DO OB Fellow, [place] for [place] 03/14/2022, 7:37 AM

## 2022-03-15 ENCOUNTER — Encounter: Payer: Medicaid Other | Admitting: Obstetrics and Gynecology

## 2022-03-15 MED ORDER — ACETAMINOPHEN 325 MG PO TABS: 650.0000 mg | ORAL_TABLET | ORAL | 0 refills | Status: AC | PRN

## 2022-03-15 MED ORDER — IBUPROFEN 600 MG PO TABS: 600.0000 mg | ORAL_TABLET | Freq: Four times a day (QID) | ORAL | 0 refills | Status: AC

## 2022-03-27 ENCOUNTER — Telehealth (HOSPITAL_COMMUNITY): Payer: Self-pay | Admitting: *Deleted

## 2022-03-27 NOTE — Telephone Encounter (Signed)
Hospital Discharge Follow-Up Call:  Patient reports that she is well.  Her only concern is urinary incontinence which she says has improved since delivery.  Right after delivery she said she urinated every time she stood up, now she is only leaking urine on occasion.  Encouraged her to mention this at her postpartum checkup if issue has not resolved by then.  EPDS today was 3 and she endorses this accurately reflects that she is doing well emotionally.  Patient says that baby is well and she has no concerns about baby's health.  She reports that baby sleeps in a bedside bassinet.  Reviewed ABCs of Safe Sleep.

## 2022-05-04 ENCOUNTER — Ambulatory Visit (INDEPENDENT_AMBULATORY_CARE_PROVIDER_SITE_OTHER): Payer: Medicaid Other | Admitting: Obstetrics and Gynecology

## 2022-05-04 ENCOUNTER — Encounter: Payer: Self-pay | Admitting: Obstetrics and Gynecology

## 2022-05-04 MED ORDER — NORETHINDRONE 0.35 MG PO TABS: 1.0000 | ORAL_TABLET | Freq: Every day | ORAL | 11 refills | Status: AC

## 2022-05-04 NOTE — Progress Notes (Signed)
    Post Partum Visit Note  Cindy Sellers is a 21 y.o. G59P1001 female who presents for a postpartum visit. She is 6 weeks postpartum following a normal spontaneous vaginal delivery.  I have fully reviewed the prenatal and intrapartum course. The delivery was at 40.5 gestational weeks.  Anesthesia: epidural. Postpartum course has been unremarkable. Baby is doing well. Baby is feeding by breast. Bleeding no bleeding. Bowel function is normal. Bladder function is normal. Patient is not sexually active. Contraception method is abstinence.   Postpartum depression screening: negative, score 0.   The pregnancy intention screening data noted above was reviewed. Potential methods of contraception were discussed. The patient elected to proceed with No data recorded.    Health Maintenance Due  Topic Date Due   COVID-19 Vaccine (1) Never done   HPV VACCINES (1 - 2-dose series) Never done   INFLUENZA VACCINE  Never done    The following portions of the patient's history were reviewed and updated as appropriate: allergies, current medications, past family history, past medical history, past social history, past surgical history, and problem list.  Review of Systems Pertinent items noted in HPI and remainder of comprehensive ROS otherwise negative.  Objective:  There were no vitals taken for this visit.   General:  alert   Breasts:  not indicated  Lungs: clear to auscultation bilaterally  Heart:  regular rate and rhythm, S1, S2 normal, no murmur, click, rub or gallop  Abdomen: soft, non-tender; bowel sounds normal; no masses,  no organomegaly   Wound NA  GU exam:  not indicated       Assessment:    There are no diagnoses linked to this encounter.  Nl postpartum exam.   Plan:   Essential components of care per ACOG recommendations:  1.  Mood and well being: Patient with negative depression screening today. Reviewed local resources for support.  - Patient tobacco use? No.   - hx of  drug use? No.    2. Infant care and feeding:  -Patient currently breastmilk feeding? yes -Social determinants of health (SDOH) reviewed in EPIC. No concerns  3. Sexuality, contraception and birth spacing - Patient does not want a pregnancy in the next year.  Desired family size is uncertain.  - Reviewed reproductive life planning. Reviewed contraceptive methods based on pt preferences and effectiveness.  Patient desired Oral Contraceptive today.   - Discussed birth spacing of 18 months  4. Sleep and fatigue -Encouraged family/partner/community support of 4 hrs of uninterrupted sleep to help with mood and fatigue  5. Physical Recovery  - Discussed patients delivery and complications. She describes her labor as good. - Patient had a Vaginal, no problems at delivery. Patient had a 2nd degree laceration. Perineal healing reviewed. Patient expressed understanding - Patient has urinary incontinence? No. - Patient is safe to resume physical and sexual activity  6.  Health Maintenance - HM due items addressed Yes - Last pap smear  Diagnosis  Date Value Ref Range Status  09/18/2021   Final   - Negative for intraepithelial lesion or malignancy (NILM)   Pap smear not done at today's visit.  -Breast Cancer screening indicated? No.   7. Chronic Disease/Pregnancy Condition follow up: None  - PCP follow up  Nettie Elm. MD Center for [employer], [facility]

## 2022-05-04 NOTE — Patient Instructions (Addendum)
Kegel Exercises  Kegel exercises can help strengthen your pelvic floor muscles. The pelvic floor is a group of muscles that support your rectum, small intestine, and bladder. In females, pelvic floor muscles also help support the uterus. These muscles help you control the flow of urine and stool (feces). Kegel exercises are painless and simple. They do not require any equipment. Your provider may suggest Kegel exercises to: Improve bladder and bowel control. Improve sexual response. Improve weak pelvic floor muscles after surgery to remove the uterus (hysterectomy) or after pregnancy, in females. Improve weak pelvic floor muscles after prostate gland removal or surgery, in males. Kegel exercises involve squeezing your pelvic floor muscles. These are the same muscles you squeeze when you try to stop the flow of urine or keep from passing gas. The exercises can be done while sitting, standing, or lying down, but it is best to vary your position. Ask your health care provider which exercises are safe for you. Do exercises exactly as told by your health care provider and adjust them as directed. Do not begin these exercises until told by your health care provider. Exercises How to do Kegel exercises: Squeeze your pelvic floor muscles tight. You should feel a tight lift in your rectal area. If you are a female, you should also feel a tightness in your vaginal area. Keep your stomach, buttocks, and legs relaxed. Hold the muscles tight for up to 10 seconds. Breathe normally. Relax your muscles for up to 10 seconds. Repeat as told by your health care provider. Repeat this exercise daily as told by your health care provider. Continue to do this exercise for at least 4-6 weeks, or for as long as told by your health care provider. You may be referred to a physical therapist who can help you learn more about how to do Kegel exercises. Depending on your condition, your health care provider may  recommend: Varying how long you squeeze your muscles. Doing several sets of exercises every day. Doing exercises for several weeks. Making Kegel exercises a part of your regular exercise routine. This information is not intended to replace advice given to you by your health care provider. Make sure you discuss any questions you have with your health care provider. Document Revised: 09/22/2020 Document Reviewed: 09/22/2020 Elsevier Patient Education  2023 [employer] Health Maintenance, Female Adopting a healthy lifestyle and getting preventive care are important in promoting health and wellness. Ask your health care provider about: The right schedule for you to have regular tests and exams. Things you can do on your own to prevent diseases and keep yourself healthy. What should I know about diet, weight, and exercise? Eat a healthy diet  Eat a diet that includes plenty of vegetables, fruits, low-fat dairy products, and lean protein. Do not eat a lot of foods that are high in solid fats, added sugars, or sodium. Maintain a healthy weight Body mass index (BMI) is used to identify weight problems. It estimates body fat based on height and weight. Your health care provider can help determine your BMI and help you achieve or maintain a healthy weight. Get regular exercise Get regular exercise. This is one of the most important things you can do for your health. Most adults should: Exercise for at least 150 minutes each week. The exercise should increase your heart rate and make you sweat (moderate-intensity exercise). Do strengthening exercises at least twice a week. This is in addition to the moderate-intensity exercise. Spend less time sitting. Even light  physical activity can be beneficial. Watch cholesterol and blood lipids Have your blood tested for lipids and cholesterol at 22 years of age, then have this test every 5 years. Have your cholesterol levels checked more often if: Your  lipid or cholesterol levels are high. You are older than 22 years of age. You are at high risk for heart disease. What should I know about cancer screening? Depending on your health history and family history, you may need to have cancer screening at various ages. This may include screening for: Breast cancer. Cervical cancer. Colorectal cancer. Skin cancer. Lung cancer. What should I know about heart disease, diabetes, and high blood pressure? Blood pressure and heart disease High blood pressure causes heart disease and increases the risk of stroke. This is more likely to develop in people who have high blood pressure readings or are overweight. Have your blood pressure checked: Every 3-5 years if you are 42-22 years of age. Every year if you are 20 years old or older. Diabetes Have regular diabetes screenings. This checks your fasting blood sugar level. Have the screening done: Once every three years after age 73 if you are at a normal weight and have a low risk for diabetes. More often and at a younger age if you are overweight or have a high risk for diabetes. What should I know about preventing infection? Hepatitis B If you have a higher risk for hepatitis B, you should be screened for this virus. Talk with your health care provider to find out if you are at risk for hepatitis B infection. Hepatitis C Testing is recommended for: Everyone born from [birth year] through 1966. Anyone with known risk factors for hepatitis C. Sexually transmitted infections (STIs) Get screened for STIs, including gonorrhea and chlamydia, if: You are sexually active and are younger than 22 years of age. You are older than 22 years of age and your health care provider tells you that you are at risk for this type of infection. Your sexual activity has changed since you were last screened, and you are at increased risk for chlamydia or gonorrhea. Ask your health care provider if you are at risk. Ask your health  care provider about whether you are at high risk for HIV. Your health care provider may recommend a prescription medicine to help prevent HIV infection. If you choose to take medicine to prevent HIV, you should first get tested for HIV. You should then be tested every 3 months for as long as you are taking the medicine. Pregnancy If you are about to stop having your period (premenopausal) and you may become pregnant, seek counseling before you get pregnant. Take 400 to 800 micrograms (mcg) of folic acid every day if you become pregnant. Ask for birth control (contraception) if you want to prevent pregnancy. Osteoporosis and menopause Osteoporosis is a disease in which the bones lose minerals and strength with aging. This can result in bone fractures. If you are 27 years old or older, or if you are at risk for osteoporosis and fractures, ask your health care provider if you should: Be screened for bone loss. Take a calcium or vitamin D supplement to lower your risk of fractures. Be given hormone replacement therapy (HRT) to treat symptoms of menopause. Follow these instructions at home: Alcohol use Do not drink alcohol if: Your health care provider tells you not to drink. You are pregnant, may be pregnant, or are planning to become pregnant. If you drink alcohol: Limit how much you  have to: 0-1 drink a day. Know how much alcohol is in your drink. In the [place] one drink equals one 12 oz bottle of beer (355 mL), one 5 oz glass of wine (148 mL), or one 1 oz glass of hard liquor (44 mL). Lifestyle Do not use any products that contain nicotine or tobacco. These products include cigarettes, chewing tobacco, and vaping devices, such as e-cigarettes. If you need help quitting, ask your health care provider. Do not use street drugs. Do not share needles. Ask your health care provider for help if you need support or information about quitting drugs. General instructions Schedule regular health,  dental, and eye exams. Stay current with your vaccines. Tell your health care provider if: You often feel depressed. You have ever been abused or do not feel safe at home. Summary Adopting a healthy lifestyle and getting preventive care are important in promoting health and wellness. Follow your health care provider's instructions about healthy diet, exercising, and getting tested or screened for diseases. Follow your health care provider's instructions on monitoring your cholesterol and blood pressure. This information is not intended to replace advice given to you by your health care provider. Make sure you discuss any questions you have with your health care provider. Document Revised: 10/03/2020 Document Reviewed: 10/03/2020 Elsevier Patient Education  2023 [employer].

## 2022-07-06 ENCOUNTER — Encounter (HOSPITAL_COMMUNITY): Payer: Self-pay

## 2022-07-06 ENCOUNTER — Emergency Department (HOSPITAL_COMMUNITY)
Admission: EM | Admit: 2022-07-06 | Discharge: 2022-07-06 | Disposition: A | Discharge status: Home / Self Care | Payer: Medicaid Other | Attending: Emergency Medicine | Admitting: Emergency Medicine

## 2022-07-06 DIAGNOSIS — T65891A Toxic effect of other specified substances, accidental (unintentional), initial encounter: Secondary | ICD-10-CM | POA: Diagnosis not present

## 2022-07-06 DIAGNOSIS — T6591XA Toxic effect of unspecified substance, accidental (unintentional), initial encounter: Secondary | ICD-10-CM

## 2022-07-06 LAB — SALICYLATE LEVEL: Salicylate Lvl: <7 mg/dL — ABNORMAL LOW (ref 7.0–30.0)

## 2022-07-06 LAB — RAPID URINE DRUG SCREEN, HOSP PERFORMED
Amphetamines: NOT DETECTED
Barbiturates: NOT DETECTED
Benzodiazepines: NOT DETECTED
Cocaine: NOT DETECTED
Opiates: NOT DETECTED
Tetrahydrocannabinol: POSITIVE — AB

## 2022-07-06 LAB — COMPREHENSIVE METABOLIC PANEL
ALT: 17 U/L (ref 0–44)
AST: 18 U/L (ref 15–41)
Albumin: 3.5 g/dL (ref 3.5–5.0)
Alkaline Phosphatase: 85 U/L (ref 38–126)
Anion gap: 8 (ref 5–15)
BUN: 10 mg/dL (ref 6–20)
CO2: 25 mmol/L (ref 22–32)
Calcium: 9.2 mg/dL (ref 8.9–10.3)
Chloride: 102 mmol/L (ref 98–111)
Creatinine, Ser: 0.56 mg/dL (ref 0.44–1.00)
GFR, Estimated: >60 mL/min (ref >60)
Glucose, Bld: 106 mg/dL — ABNORMAL HIGH (ref 70–99)
Potassium: 3.7 mmol/L (ref 3.5–5.1)
Sodium: 135 mmol/L (ref 135–145)
Total Bilirubin: 0.1 mg/dL — ABNORMAL LOW (ref 0.3–1.2)
Total Protein: 7.2 g/dL (ref 6.5–8.1)

## 2022-07-06 LAB — ETHANOL: Alcohol, Ethyl (B): <10 mg/dL (ref <10)

## 2022-07-06 LAB — CBC WITH DIFFERENTIAL/PLATELET
Abs Immature Granulocytes: 0.01 10*3/uL (ref 0.00–0.07)
Basophils Absolute: 0 10*3/uL (ref 0.0–0.1)
Basophils Relative: 0 %
Eosinophils Absolute: 0.2 10*3/uL (ref 0.0–0.5)
Eosinophils Relative: 3 %
HCT: 38 % (ref 36.0–46.0)
Hemoglobin: 12.5 g/dL (ref 12.0–15.0)
Immature Granulocytes: 0 %
Lymphocytes Relative: 31 %
Lymphs Abs: 1.9 10*3/uL (ref 0.7–4.0)
MCH: 26.5 pg (ref 26.0–34.0)
MCHC: 32.9 g/dL (ref 30.0–36.0)
MCV: 80.7 fL (ref 80.0–100.0)
Monocytes Absolute: 0.6 10*3/uL (ref 0.1–1.0)
Monocytes Relative: 9 %
Neutro Abs: 3.4 10*3/uL (ref 1.7–7.7)
Neutrophils Relative %: 57 %
Platelets: 287 10*3/uL (ref 150–400)
RBC: 4.71 MIL/uL (ref 3.87–5.11)
RDW: 12.7 % (ref 11.5–15.5)
WBC: 6.1 10*3/uL (ref 4.0–10.5)
nRBC: 0 % (ref 0.0–0.2)

## 2022-07-06 LAB — I-STAT BETA HCG BLOOD, ED (MC, WL, AP ONLY): I-stat hCG, quantitative: <5 m[IU]/mL (ref <5)

## 2022-07-06 LAB — ACETAMINOPHEN LEVEL: Acetaminophen (Tylenol), Serum: <10 ug/mL — ABNORMAL LOW (ref 10–30)

## 2022-07-06 NOTE — ED Notes (Signed)
Pt tolerated food and PO challenge. Denies nausea, vomiting, or abdominal pain

## 2022-07-06 NOTE — ED Notes (Signed)
Food and drink given to patient. JRPRN

## 2022-07-06 NOTE — Discharge Instructions (Addendum)
Return if any problems.

## 2022-07-06 NOTE — ED Provider Notes (Signed)
[place] EMERGENCY DEPARTMENT AT [facility] Provider Note   CSN: [id number] Arrival date & time: 07/06/22  [id number]     History  Chief Complaint  Patient presents with   Poisoning    Cindy Sellers is a 22 y.o. female.  22 yo F currently breastfeeding presents after accidental ingestion of shoe cleaner this morning. She says she was half asleep, picked up a water bottle, started drinking out of it, and realized it was a water bottle with shoe cleaner. She drank about  of the bottle. She denies any headache, vision changes, nausea or vomiting. She has some mild abdominal pain but states she came in more because she is worried because she is breast feeding. They are not sure what the agent is, it is in a plain water bottle and was bought at a flea/street market. They bought it in a plastic water bottle and have no idea what it contains. Pt is currently breast feeding   The history is provided by the patient. No language interpreter was used.       Home Medications Prior to Admission medications   Medication Sig Start Date End Date Taking? Authorizing Provider  acetaminophen (TYLENOL) 325 MG tablet Take 2 tablets (650 mg total) by mouth every 4 (four) hours as needed (for pain scale < 4). 03/15/22   Deloria Lair, DO  norethindrone (MICRONOR) 0.35 MG tablet Take 1 tablet (0.35 mg total) by mouth daily. 05/04/22   Chancy Milroy, MD  Prenatal Vit-Fe Fumarate-FA (PRENATAL PO) Take 1 tablet by mouth daily.    [provider]      Allergies    Patient has no known allergies.    Review of Systems   Review of Systems  Constitutional:  Negative for activity change and fatigue.  Respiratory:  Positive for shortness of breath.   Gastrointestinal:  Negative for abdominal pain and vomiting.  All other systems reviewed and are negative.   Physical Exam Updated Vital Signs BP 110/64 (BP Location: Right Arm)   Pulse 91   Temp 98.7 F (37.1 C) (Oral)   Resp 18    SpO2 98%  Physical Exam Vitals and nursing note reviewed.  Constitutional:      Appearance: She is well-developed.  HENT:     Head: Normocephalic.  Cardiovascular:     Rate and Rhythm: Normal rate.  Pulmonary:     Effort: Pulmonary effort is normal.  Abdominal:     General: Abdomen is flat. There is no distension.     Palpations: Abdomen is soft.  Musculoskeletal:        General: Normal range of motion.     Cervical back: Normal range of motion.  Skin:    General: Skin is warm.  Neurological:     General: No focal deficit present.     Mental Status: She is alert and oriented to person, place, and time.     ED Results / Procedures / Treatments   Labs (all labs ordered are listed, but only abnormal results are displayed) Labs Reviewed  COMPREHENSIVE METABOLIC PANEL - Abnormal; Notable for the following components:      Result Value   Glucose, Bld 106 (*)    Total Bilirubin 0.1 (*)    All other components within normal limits  ACETAMINOPHEN LEVEL - Abnormal; Notable for the following components:   Acetaminophen (Tylenol), Serum <10 (*)    All other components within normal limits  SALICYLATE LEVEL - Abnormal; Notable for the following  components:   Salicylate Lvl [id number] (*)    All other components within normal limits  RAPID URINE DRUG SCREEN, HOSP PERFORMED - Abnormal; Notable for the following components:   Tetrahydrocannabinol POSITIVE (*)    All other components within normal limits  CBC WITH DIFFERENTIAL/PLATELET  ETHANOL  I-STAT BETA HCG BLOOD, ED ([facility], [facility], [facility] ONLY)    EKG None  Radiology No results found.  Procedures Procedures    Medications Ordered in ED Medications - No data to display  ED Course/ Medical Decision Making/ A&P                             Medical Decision Making Patient complains of having drank a shoe cleaner that was bought at a flea market.  The cleaner is unlabeled in a water bottle.  Patient denies having any symptoms no  abdominal pain no vomiting denies throat burning  Amount and/or Complexity of Data Reviewed Independent Historian: spouse    Details: Patient is here with her significant other who is supportive Labs: ordered. Decision-making details documented in ED Course.    Details: Labs ordered reviewed and interpreted CBC and c-Met are normal urine drug screen is positive for marijuana  Risk Risk Details: Patient observed in the emergency department she is able to drink fluids she is able to eat she is not having any symptoms from ingestion.  pH of substance is between 6 and 7.  Has been over 6 hours since digestion.  Patient is advised she should use breast pump and dispose of breastmilk for the next 24 hours.  Patient is counseled on securing her home environment as she has a small child           Final Clinical Impression(s) / ED Diagnoses Final diagnoses:  Ingestion of nontoxic substance, accidental or unintentional, initial encounter    Rx / DC Orders ED Discharge Orders     None     An After Visit Summary was printed and given to the patient.     Fransico Meadow, [place] 07/06/22 1114    Ezequiel Essex, MD 07/06/22 1530

## 2022-07-06 NOTE — ED Triage Notes (Signed)
Was half asleep and drank some type of shoe cleaner that was in a water bottle. Pt did not realize what she was drinking immediately. States her stomach hurts some, just concerned due to currently breast feeding.

## 2022-08-26 ENCOUNTER — Emergency Department (HOSPITAL_COMMUNITY)
Admission: EM | Admit: 2022-08-26 | Discharge: 2022-08-26 | Disposition: A | Discharge status: Home / Self Care | Payer: Medicaid Other | Attending: Emergency Medicine | Admitting: Emergency Medicine

## 2022-08-26 ENCOUNTER — Encounter (HOSPITAL_COMMUNITY): Payer: Self-pay

## 2022-08-26 DIAGNOSIS — R21 Rash and other nonspecific skin eruption: Secondary | ICD-10-CM | POA: Diagnosis present

## 2022-08-26 DIAGNOSIS — T7840XA Allergy, unspecified, initial encounter: Secondary | ICD-10-CM | POA: Diagnosis not present

## 2022-08-26 MED ORDER — FAMOTIDINE IN NACL 20-0.9 MG/50ML-% IV SOLN
20.0000 mg | Freq: Once | INTRAVENOUS | Status: AC
  Administered 2022-08-26: 20 mg via INTRAVENOUS
  Filled 2022-08-26: qty 50

## 2022-08-26 MED ORDER — DIPHENHYDRAMINE HCL 50 MG/ML IJ SOLN
25.0000 mg | Freq: Once | INTRAMUSCULAR | Status: AC
  Administered 2022-08-26: 25 mg via INTRAVENOUS
  Filled 2022-08-26: qty 1

## 2022-08-26 MED ORDER — DEXAMETHASONE SODIUM PHOSPHATE 10 MG/ML IJ SOLN
10.0000 mg | Freq: Once | INTRAMUSCULAR | Status: AC
  Administered 2022-08-26: 10 mg via INTRAVENOUS
  Filled 2022-08-26: qty 1

## 2022-08-26 MED ORDER — EPINEPHRINE 0.3 MG/0.3ML IJ SOAJ: 0.3000 mg | INTRAMUSCULAR | 0 refills | Status: AC | PRN

## 2022-08-26 NOTE — Discharge Instructions (Signed)
Thank you for letting us take care of you today.  We treated you for an allergic reaction with good relief of symptoms.  For any lingering itching or rash, you may use over-the-counter Benadryl as needed at home.  I am prescribing an EpiPen if you have recurrent severe reactions that involve difficulty breathing or swallowing.  Please use this only if you believe you are having a severe allergic reaction.  If you continue to have recurrent, more minor reactions, I do recommend that you follow-up with your PCP as we discussed because you may benefit from seeing an allergist and having allergy testing.  For any new or worsening symptoms such as chest pain, shortness of breath, throat swelling, difficulty swallowing, fever, confusion, or other new, concerning symptoms, please return to the nearest emergency department or call 911.

## 2022-08-26 NOTE — ED Triage Notes (Signed)
Pt c/o rash that started on bilateral knees that has now spread down to feet and hands. Pt denies any new lotions, detergents, soaps, etc.

## 2022-08-26 NOTE — ED Provider Notes (Signed)
[place] Provider Note   CSN: [id number] Arrival date & time: 08/26/22  [id number]     History  Chief Complaint  Patient presents with   Rash    Cindy Sellers is a 22 y.o. female with no past medical history who presents to the ED complaining of a rash mostly to her legs that started yesterday.  She states that she noticed a splotchy rash to both of her legs last night and has since developed some spots on her arms and face as well.  She notes that she went to the park yesterday wearing shorts but was not bitten or stung by anything that she knows of.  She has no history of previous allergic reactions.  She denies any new medications or products.  She denies shortness of breath, wheezing, difficulty swallowing, chest pain, nausea, vomiting, or other complaints today.      Home Medications Prior to Admission medications   Medication Sig Start Date End Date Taking? Authorizing Provider  EPINEPHrine 0.3 mg/0.3 mL IJ SOAJ injection Inject 0.3 mg into the muscle as needed for anaphylaxis. 08/26/22  Yes Raianna Slight L, PA-C  acetaminophen (TYLENOL) 325 MG tablet Take 2 tablets (650 mg total) by mouth every 4 (four) hours as needed (for pain scale < 4). 03/15/22   Deloria Lair, DO  norethindrone (MICRONOR) 0.35 MG tablet Take 1 tablet (0.35 mg total) by mouth daily. 05/04/22   Chancy Milroy, MD  Prenatal Vit-Fe Fumarate-FA (PRENATAL PO) Take 1 tablet by mouth daily.    [provider]      Allergies    Patient has no known allergies.    Review of Systems   Review of Systems  All other systems reviewed and are negative.   Physical Exam Updated Vital Signs BP 104/70 (BP Location: Left Arm)   Pulse 82   Temp 98.5 F (36.9 C) (Oral)   Resp 16   Ht 5\' 2"  (1.575 m)   Wt 77.1 kg   SpO2 100%   Breastfeeding Yes   BMI 31.09 kg/m  Physical Exam Vitals and nursing note reviewed.  Constitutional:      General: She is not  in acute distress.    Appearance: Normal appearance. She is not ill-appearing or toxic-appearing.  HENT:     Head: Normocephalic and atraumatic.     Mouth/Throat:     Mouth: Mucous membranes are moist.     Pharynx: Oropharynx is clear. No oropharyngeal exudate or posterior oropharyngeal erythema.  Eyes:     Conjunctiva/sclera: Conjunctivae normal.  Cardiovascular:     Rate and Rhythm: Normal rate and regular rhythm.     Heart sounds: No murmur heard. Pulmonary:     Effort: Pulmonary effort is normal. No respiratory distress.     Breath sounds: Normal breath sounds. No stridor. No wheezing, rhonchi or rales.  Chest:     Chest wall: No tenderness.  Abdominal:     General: Abdomen is flat. There is no distension.     Palpations: Abdomen is soft.     Tenderness: There is no abdominal tenderness. There is no guarding or rebound.  Musculoskeletal:        General: Normal range of motion.     Cervical back: Normal range of motion and neck supple.     Right lower leg: No edema.     Left lower leg: No edema.  Skin:    General: Skin is warm and dry.  Capillary Refill: Capillary refill takes less than 2 seconds.     Comments: Urticarial rash mostly to bilateral lower extremities with a few small spots to upper extremities with areas of easily blanching erythema and mild associated swelling, consistent with allergic reaction  Neurological:     Mental Status: She is alert. Mental status is at baseline.  Psychiatric:        Behavior: Behavior normal.     ED Results / Procedures / Treatments   Labs (all labs ordered are listed, but only abnormal results are displayed) Labs Reviewed - No data to display  EKG None  Radiology No results found.  Procedures Procedures    Medications Ordered in ED Medications  diphenhydrAMINE (BENADRYL) injection 25 mg (25 mg Intravenous Given 08/26/22 1151)  dexamethasone (DECADRON) injection 10 mg (10 mg Intravenous Given 08/26/22 1153)   famotidine (PEPCID) IVPB 20 mg premix (0 mg Intravenous Stopped 08/26/22 1228)    ED Course/ Medical Decision Making/ A&P                             Medical Decision Making Risk Prescription drug management.   Medical Decision Making:   Cindy Sellers is a 22 y.o. female who presented to the ED today with rash detailed above.    Patient's presentation is complicated by their history of currently lactating.  Complete initial physical exam performed, notably the patient  was in no acute distress.  Lungs clear to auscultation.  Patient neurologically intact.  She has urticarial rash mostly to the lower extremities with blanching erythema and mild associated swelling.    Reviewed and confirmed nursing documentation for past medical history, family history, social history.    Initial Assessment:   With the patient's presentation of rash, most likely diagnosis is allergic reaction. Differential diagnosis includes but is not limited to anaphylaxis, contact dermatitis, insect bite, medication reaction, cellulitis This is most consistent with an acute complicated illness  Initial Plan:  Medications as above to treat allergic reaction Objective evaluation as reviewed   Final Assessment and Plan:   This is a 22 year old female who presents to the ED complaining of a rash onset yesterday.  On exam, she has an urticarial rash mostly to the lower extremities that is easily blanchable.  Small areas of erythema with associated swelling.  Lungs clear to auscultation.  No signs of respiratory distress.  Patient neurologically intact.  No signs of anaphylactic reaction.  No known triggers.  Rash does appear consistent with allergic reaction so patient treated with medications as above.  Patient had good relief following these medications and reports symptoms are almost entirely resolved.  Discussed with patient that I recommend she follow-up with primary care and/or allergist if she has recurrent  reactions.  Patient expressed understanding of this.  Provided with EpiPen in case she has any anaphylactic reactions in the future.  Strict ED return precautions given, all questions answered, and stable for discharge.   Clinical Impression:  1. Allergic reaction, initial encounter      Discharge           Final Clinical Impression(s) / ED Diagnoses Final diagnoses:  Allergic reaction, initial encounter    Rx / DC Orders ED Discharge Orders          Ordered    EPINEPHrine 0.3 mg/0.3 mL IJ SOAJ injection  As needed        08/26/22 1315  Turner Daniels 08/26/22 1323    Regan Lemming, MD 08/26/22 [phone]

## 2023-06-15 ENCOUNTER — Telehealth: Payer: Medicaid Other | Admitting: Physician Assistant

## 2023-06-15 DIAGNOSIS — N946 Dysmenorrhea, unspecified: Secondary | ICD-10-CM

## 2023-06-15 NOTE — Progress Notes (Signed)
   Thank you for the details you included in the comment boxes. Those details are very helpful in determining the best course of treatment for you and help [place] to provide the best care.Because I want to be sure we are understanding your situation and providing the best care, we recommend that you schedule a Virtual Urgent Care video visit in order for the provider to better assess what is going on.  The provider will be able to give you a more accurate diagnosis and treatment plan if we can more freely discuss your symptoms and with the addition of a virtual examination.   If you change your visit to a video visit, we will bill your insurance (similar to an office visit) and you will not be charged for this e-Visit. You will be able to stay at home and speak with the first available [facility] advanced practice provider. The link to do a video visit is in the drop down Menu tab of your Welcome screen in MyChart.
